# Patient Record
Sex: Male | Born: 1935 | Race: White | Hispanic: No | Marital: Married | Smoking: Former smoker
Health system: Southern US, Community
[De-identification: ages and names within clinical notes are randomized; demographics above are authoritative.]

## PROBLEM LIST (undated history)

## (undated) DIAGNOSIS — F419 Anxiety disorder, unspecified: Secondary | ICD-10-CM

## (undated) DIAGNOSIS — R269 Unspecified abnormalities of gait and mobility: Secondary | ICD-10-CM

## (undated) DIAGNOSIS — K219 Gastro-esophageal reflux disease without esophagitis: Secondary | ICD-10-CM

## (undated) DIAGNOSIS — F329 Major depressive disorder, single episode, unspecified: Secondary | ICD-10-CM

## (undated) DIAGNOSIS — F32A Depression, unspecified: Secondary | ICD-10-CM

## (undated) DIAGNOSIS — N200 Calculus of kidney: Secondary | ICD-10-CM

## (undated) DIAGNOSIS — I10 Essential (primary) hypertension: Secondary | ICD-10-CM

## (undated) DIAGNOSIS — R011 Cardiac murmur, unspecified: Secondary | ICD-10-CM

## (undated) DIAGNOSIS — C801 Malignant (primary) neoplasm, unspecified: Secondary | ICD-10-CM

## (undated) DIAGNOSIS — K8020 Calculus of gallbladder without cholecystitis without obstruction: Secondary | ICD-10-CM

## (undated) HISTORY — DX: Depression, unspecified: F32.A

## (undated) HISTORY — DX: Anxiety disorder, unspecified: F41.9

## (undated) HISTORY — DX: Unspecified abnormalities of gait and mobility: R26.9

## (undated) HISTORY — PX: SKIN GRAFT: SHX250

## (undated) HISTORY — DX: Calculus of kidney: N20.0

## (undated) HISTORY — DX: Major depressive disorder, single episode, unspecified: F32.9

## (undated) HISTORY — DX: Calculus of gallbladder without cholecystitis without obstruction: K80.20

---

## 1989-07-02 HISTORY — PX: KIDNEY STONE SURGERY: SHX686

## 2002-11-01 HISTORY — PX: COLONOSCOPY: SHX174

## 2002-11-01 HISTORY — PX: ESOPHAGOGASTRODUODENOSCOPY: SHX1529

## 2003-03-30 ENCOUNTER — Emergency Department (HOSPITAL_COMMUNITY): Admission: EM | Admit: 2003-03-30 | Discharge: 2003-03-30 | Payer: Self-pay | Admitting: Emergency Medicine

## 2003-07-03 ENCOUNTER — Ambulatory Visit (HOSPITAL_COMMUNITY): Admission: RE | Admit: 2003-07-03 | Discharge: 2003-07-03 | Payer: Self-pay | Admitting: Internal Medicine

## 2003-11-11 ENCOUNTER — Ambulatory Visit (HOSPITAL_COMMUNITY): Admission: RE | Admit: 2003-11-11 | Discharge: 2003-11-11 | Payer: Self-pay | Admitting: Internal Medicine

## 2003-11-19 ENCOUNTER — Ambulatory Visit (HOSPITAL_COMMUNITY): Admission: RE | Admit: 2003-11-19 | Discharge: 2003-11-19 | Payer: Self-pay | Admitting: Internal Medicine

## 2003-11-25 ENCOUNTER — Ambulatory Visit (HOSPITAL_COMMUNITY): Admission: RE | Admit: 2003-11-25 | Discharge: 2003-11-25 | Payer: Self-pay | Admitting: Internal Medicine

## 2004-09-04 ENCOUNTER — Encounter: Admission: RE | Admit: 2004-09-04 | Discharge: 2004-09-04 | Payer: Self-pay | Admitting: Neurosurgery

## 2006-11-01 HISTORY — PX: COLONOSCOPY: SHX174

## 2008-09-12 ENCOUNTER — Ambulatory Visit (HOSPITAL_COMMUNITY): Admission: RE | Admit: 2008-09-12 | Discharge: 2008-09-12 | Payer: Self-pay | Admitting: Internal Medicine

## 2008-09-18 ENCOUNTER — Encounter: Admission: RE | Admit: 2008-09-18 | Discharge: 2008-09-18 | Payer: Self-pay | Admitting: Internal Medicine

## 2008-09-18 ENCOUNTER — Other Ambulatory Visit: Admission: RE | Admit: 2008-09-18 | Discharge: 2008-09-18 | Payer: Self-pay | Admitting: Interventional Radiology

## 2008-09-18 ENCOUNTER — Encounter (INDEPENDENT_AMBULATORY_CARE_PROVIDER_SITE_OTHER): Payer: Self-pay | Admitting: Interventional Radiology

## 2008-09-24 ENCOUNTER — Ambulatory Visit (HOSPITAL_COMMUNITY): Admission: RE | Admit: 2008-09-24 | Discharge: 2008-09-24 | Payer: Self-pay | Admitting: General Surgery

## 2008-10-01 HISTORY — PX: THYROIDECTOMY: SHX17

## 2008-10-16 ENCOUNTER — Inpatient Hospital Stay (HOSPITAL_COMMUNITY): Admission: AD | Admit: 2008-10-16 | Discharge: 2008-10-18 | Payer: Self-pay | Admitting: Otolaryngology

## 2008-10-19 ENCOUNTER — Inpatient Hospital Stay (HOSPITAL_COMMUNITY): Admission: EM | Admit: 2008-10-19 | Discharge: 2008-10-22 | Payer: Self-pay | Admitting: Emergency Medicine

## 2008-12-02 ENCOUNTER — Encounter (HOSPITAL_COMMUNITY): Admission: RE | Admit: 2008-12-02 | Discharge: 2009-03-02 | Payer: Self-pay | Admitting: Otolaryngology

## 2008-12-05 ENCOUNTER — Ambulatory Visit (HOSPITAL_COMMUNITY): Admission: RE | Admit: 2008-12-05 | Discharge: 2008-12-16 | Payer: Self-pay | Admitting: Otolaryngology

## 2010-04-16 ENCOUNTER — Ambulatory Visit (HOSPITAL_COMMUNITY): Admission: RE | Admit: 2010-04-16 | Discharge: 2010-04-16 | Payer: Self-pay | Admitting: Internal Medicine

## 2010-11-22 ENCOUNTER — Encounter: Payer: Self-pay | Admitting: Otolaryngology

## 2011-03-16 NOTE — Discharge Summary (Signed)
Preston Martinez, Preston Martinez                ACCOUNT NO.:  [id number]   MEDICAL RECORD NO.:  [id number]          PATIENT TYPE:  INP   LOCATION:  6522                         FACILITY:  [facility]   PHYSICIAN:  Kinnie Scales. Annalee Genta, M.D.DATE OF BIRTH:  03-03-1925   DATE OF ADMISSION:  10/19/2008  DATE OF DISCHARGE:  10/22/2008                               DISCHARGE SUMMARY   DISCHARGE DIAGNOSES:  1. Status post total thyroidectomy for thyroid carcinoma.  (Dr. Pollyann Kennedy      on October 16, 2008).  2. Postoperative hypocalcemia.  3. Shortness of breath.  4. Dysphagia.   The patient is discharged to home in stable condition with company of  his family.   DISCHARGE MEDICATIONS:  1. Prinivil 20 mg p.o. daily.  2. Synthroid 100 mcg p.o. daily.  3. Vicodin 5/500 one to two p.o. q.4-6 h. p.r.n. pain.   ADDITIONAL DISCHARGE MEDICATIONS:  1. Vitamin D2 50,000 units p.o. daily.  2. Calcium carbonate (Tums) 3 tablets p.o. t.i.d.  3. Ventolin metered-dose inhaler 2 puffs p.o. b.i.d.   ADDITIONAL DISCHARGE INSTRUCTIONS AND WOUND CARE PRECAUTIONS:  Half-  strength hydrogen peroxide and bacitracin ointment applied to the  patient's anterior neck incision on a b.i.d. basis.  Limited physical  activity.  No lifting or straining, elevate head of bed, and use  incentive spirometer on an hourly basis while awake.  Diet is soft as  tolerated   BRIEF HISTORY:  Preston Martinez is an 77 year old white male who underwent  total thyroidectomy for thyroid carcinoma, surgery performed on October 16, 2008.  The patient had extensive lesion, which involved the  parapharyngeal musculature and left recurrent laryngeal nerve requiring  sacrifice of the nerve at the time of procedure.  He underwent  augmentation of the left vocal cord at the time of his surgical  procedure to improve voicing and swallow and help reduce the risk of  aspiration.  The patient had postoperative hypocalcemia and was sent  home with supplementation.   He initially discharged from the hospital on  October 18, 2008, in stable condition.  The following morning, the  patient had an episode of shortness of breath, throat tightness, and  difficulty clearing secretions.  He was brought to the [facility] Emergency Department for evaluation and possible admission.   HOSPITAL COURSE:  The patient was evaluated in the [facility]  Emergency Department on October 19, 2008.  The patient's calcium was  stable at 7.9.  He did not appear to have any neck infection, swelling,  or erythema and there was no stridor.  Oxygen saturation was stable.  Flexible nasal laryngoscopy was performed.  The patient had no evidence  of airway compromise or swelling.  Given the patient's history and  recent surgical procedure as well as family concerns, he was admitted to  the hospital for observation, admitted to unit 6500 for telemetry and  pulse oximetry.  The patient was monitored closely.  On the first  hospital day, he had another episode of perceived shortness of breath  exacerbated by anxiety, but oxygen level was stable throughout  with room  air saturations at 95% or better.  There was no significant stridor and  after coughing various secretions, he improved.  He continued to have  some dysphagia for soft diet.  The calcium levels were checked and  supplemented with calcium carbonate and vitamin D2.  Serum calcium level  had risen to 8.4 within normal limits.  The patient was started on  incentive spirometry and albuterol.  Chest x-ray was negative.  He had a  gradual improvement in symptoms and on the morning of October 22, 2008,  was stable, tolerating soft oral diet without difficulty.  No further  airway issues, and the patient's family felt it is safe for him to be  discharged to home.  The above discharge instructions were discussed in  detail with the patient and his family and he will follow up as an  outpatient to our office for  suture removal and further care.            ______________________________  Kinnie Scales. Annalee Genta, M.D.     DLS/MEDQ  D:  65/78/4696  T:  10/22/2008  Job:  [id number]

## 2011-03-16 NOTE — Op Note (Signed)
NAME:  Preston Martinez, Preston Martinez                ACCOUNT NO.:  [id number]   MEDICAL RECORD NO.:  [id number]          PATIENT TYPE:  OIB   LOCATION:  5123                         FACILITY:  [facility]   PHYSICIAN:  Jefry H. Pollyann Kennedy, MD     DATE OF BIRTH:  05-15-1925   DATE OF PROCEDURE:  DATE OF DISCHARGE:                               OPERATIVE REPORT   PREOPERATIVE DIAGNOSIS:  Thyroid carcinoma.   POSTOPERATIVE DIAGNOSIS:  Thyroid carcinoma.   PROCEDURE:  Total thyroidectomy and hydroxyapatite injection of the left  vocal cord for medialization.   SURGEON:  Jefry H. Pollyann Kennedy, MD.   SECONDARY POSTOPERATIVE DIAGNOSIS:  Left vocal cord paralysis.   COMPLICATIONS:  No complications.   FINDINGS:  Very large rock hard mass involving the majority of the left  lobe of the thyroid with some erosion into the left thyroid cartilage  and into the internal jugular vein.  The carotid artery was clear.  The  esophagus was clear.  There was no obvious adenopathy.  A right inferior  parathyroid was preserved with his blood supply.  The right recurrent  nerve was preserved as well.  The left recurrent nerve was sacrificed  because of tumor involvement.   REFERRING PHYSICIAN:  Angelia Mould. Derrell Lolling, MD  Kingsley Callander Ouida Sills, MD   HISTORY:  An 77 year old with a biopsy-proven papillary carcinoma of the  left thyroid with radiographic evidence of invasion into the larynx,  esophagus, and the great vessels.  Risks, benefits, alternatives, and  complications to the procedure were explained to the patient and his  family.  He seemed to understand and agreed to surgery.   PROCEDURE:  The patient was taken to the operating room and placed in  the operating room table in supine position.  Following induction of  general endotracheal anesthesia, the neck was prepped and draped in  standard fashion.  Transverse incision was outlined overlying the  clavicle and electrocautery was used to incise through skin and  subcutaneous tissue and  the platysma layer.  Subplatysmal flaps were  developed superiorly to the thyroid notch and inferiorly to the  clavicle.  Thyroid retractor was used throughout the case.  The midline  fascia was divided and the right side strap muscles were reflected  laterally exposing the right lobe of the gland.  There were some  nodularity, but no obvious tumor present.  The superior vasculature were  separately identified ligated between clamps and divided.  The middle  thyroid vein was treated similarly.  The inferior vasculature was also  individually ligated between clamps and divided.   Parathyroid was identified and was preserved with his blood supply as  well.  The ligament of  Allyson Sabal was divided using electrocautery and the  thyroid lobe was brought off the face of the trachea.  The recurrent  nerve was left in place and intact.  The left side dissection was then  accomplished.  The strap muscles were sacrificed surrounding the tumor  because of adherence to the tumor.  The tumor was also infiltrating into  the internal jugular vein and this was also sacrificed  by ligating  between clamps and dividing using 2-0 silk free tie and 3-0 silk stick  tie on the stumps upper and lower.  The common carotid artery was  identified and I was able to obtain a clean dissection plane between the  tumor and the vessel.  The biggest nerve I believe was also kept intact  posterior to the carotid.  Dissection continued towards the medial  direction and the esophagus was dissected free at the tumor.  The tumor  was infiltrating and adhesed to the lower part of the thyroid cartilage  where there was a scalped area about 1.5 cm at the lower aspect of the  thyroid cartilage that appeared to have been everted.  There was no  obvious tumor growth into the larynx, however.  The cricothyroid  membrane was intact.  The recurrent nerve entry was not identified  during this part of the dissection and it was presumed to be  incase by  tumor and was less sacrificed.  The pretracheal tissue was dissected as  well as the thyroid was riding fairly low in the neck and there was  minimal specimen, but there were some fiber fatty tissue and couple of  very small benign-appearing lymph nodes.   The inferior thyroid vasculature on the right was preserved along with  the parathyroid gland.  The wound was irrigated with saline.  Hemostasis  was completed using cautery and ties as needed.  The wound was closed in  layers using 3-0 chromic and the midline fascia and the platysmal layer.  The 5-0 nylon was used on the skin.  A 7-French round JP drain was left  in the wound actually through the right side.  It was secured and placed  with nylon suture.   Direct laryngoscopy with hydroxyapatite injection of left vocal cord.  The patient was allowed to start breathing spontaneously.  The maxillary  tooth protector was used.  The Jako laryngoscope was used to view the  larynx and with stimulation by the endotracheal tube.  The right cord  removed.  The left one did not remove at all.  Hydroxyapatite injection  was then performed in left vocal cord concentrating posteriorly,  injecting about 0.6 mL of the hydroxyapatite.  There was nice fullness  that resulted from the injection of the posterior cord on the left.  The  scope was then removed.  The patient was awakened, extubated,  transferred to recovery in stable condition.      Jefry H. Pollyann Kennedy, MD  Electronically Signed     JHR/MEDQ  D:  10/16/2008  T:  10/17/2008  Job:  [id number]   cc:   Angelia Mould. Derrell Lolling, M.D.  Kingsley Callander. Ouida Sills, MD

## 2011-03-19 NOTE — Op Note (Signed)
NAME:  Preston Martinez, Preston Martinez                          ACCOUNT NO.:  [id number]   MEDICAL RECORD NO.:  [id number]                   PATIENT TYPE:  AMB   LOCATION:  DAY                                  FACILITY:  [facility]   PHYSICIAN:  Lionel December, M.D.                 DATE OF BIRTH:  [birth year]   DATE OF PROCEDURE:  07/03/2003  DATE OF DISCHARGE:                                 OPERATIVE REPORT   PROCEDURE:  Esophagogastroduodenoscopy with esophageal dilatation followed  by total colonoscopy with polypectomy.   INDICATION:  Preston Martinez is a 77 year old, Caucasian male, who is noted to have  microcytic anemia by Preston Martinez. Preston Martinez, M.D.  His stool is guaiac negative.  He  does give ______ history of dysphagia and intermittent heartburn.  It is  possible that his microcytic anemia is due to repeated blood donation.  He  is undergoing diagnostic evaluation.  Procedure risks were reviewed with the  patient.  Informed consent was obtained.   PREOPERATIVE MEDICATIONS:  1. Cetacaine spray for pharyngeal topical anesthesia.  2. Demerol 25 mg IV.  3. Versed 4 mg IV.   FINDINGS:  Procedure is performed in endoscopy suite.  The patient's vital  signs and O2 saturations were monitored during the procedure, remained  stable.   PROCEDURE #1:  Esophagogastroduodenoscopy.  The patient was placed in the  left lateral decubitus position.  Endoscope was passed via oropharynx  without any difficulty into esophagus.   ESOPHAGUS:  Mucosa of the esophagus normal.  He had soft stricture at GE  junction with mucosal erythema and edema and a small sliding hiatal hernia  no more than 3 cm in length.   STOMACH:  It was empty and distended very well with insufflation.  Folds of  the proximal stomach were normal.  Examination of mucosa at body, antrum,  pyloric channel, as well as angularis, fundus, and cardia was normal.   DUODENUM:  Examination of the bulb, second part of the duodenum was also  normal.  Endoscope was  withdrawn.   Esophagus was dilated by passing 54 [place] Maloney dilator to full  insertion.  Esophagus was reexamined postdilatation, and the stricture was  noted to have been effectively disrupted.  Picture taken for the record.  Endoscope was withdrawn and patient prepared for procedure #2.   TOTAL COLONOSCOPY:  A rectal examination was performed.  No abnormality  noted on external or digital exam.  Olympus videoscope was placed in the  rectum and advanced under direct vision to sigmoid colon and beyond.  Preparation was satisfactory.  Multiple diverticula were noted at the  sigmoid and descending colon with a few more in the proximal colon.  The  scope was passed cecum which was identified by ileocecal valve.  Blunt end  of cecum was also well seen and was normal.  There was a small polyp to the  left of appendiceal orifice  which was ablated via cold biopsy, and there was  one above the ileocecal valve.  This was also ablated via cold biopsy.  There was at least a 15 mm flat polyp at hepatic flexure which was snared  piecemeal.  All three pieces were retrieved for histologic examination.  Mucosa of rest of the colon was normal.  In the rectum, there were two small  polyps that were ablated by coagulation.  The scope was retroflexed to  examine anorectal junction which was unremarkable.  Endoscope was  straightened and withdrawn.  The patient tolerated the procedure well.   FINAL DIAGNOSES:  1. A soft stricture at gastroesophageal junction with changes of reflux     esophagitis limited to gastroesophageal junction.  2. Small sliding hiatal hernia.  Normal examination of stomach plus second     part of the duodenum.  3. Esophagus dilated by passing 54 [place] Maloney dilator.  4. Pancolonic diverticulosis.  5. Large 15 mm flat polyp snared from hepatic flexure in piecemeal fashion.  6. Two small polyps from the ascending colon/cecum were ablated via cold     biopsy.  Two small polyps  of rectum were coagulated using snares.   RECOMMENDATIONS:  1. Standard instructions given.  2. Antireflux measures.  3. Prilosec 20 mg before breakfast daily.  4. Citrucel 1 tablespoonful daily along with high fiber diet.  5. I will be contacting patient with biopsy results and further     recommendations.  6. He will also resume his iron.  I would like for him to wait at least 3-4     months before he goes for another blood donation.  If he is going to do     this on regular basis, he needs to be on iron therapy chronically.                                               Lionel December, M.D.    NR/MEDQ  D:  07/03/2003  T:  07/03/2003  Job:  [id number]   cc:   Preston Martinez. Preston Martinez, M.D.  [address]  [place]  [place] [address]  Fax: [phone]

## 2011-05-12 ENCOUNTER — Emergency Department (HOSPITAL_COMMUNITY): Payer: Medicare Other

## 2011-05-12 ENCOUNTER — Inpatient Hospital Stay (HOSPITAL_COMMUNITY)
Admission: EM | Admit: 2011-05-12 | Discharge: 2011-05-14 | DRG: 392 | Disposition: A | Discharge status: Home / Self Care | Payer: Medicare Other | Attending: Internal Medicine | Admitting: Internal Medicine

## 2011-05-12 DIAGNOSIS — Z860100 Personal history of colon polyps, unspecified: Secondary | ICD-10-CM

## 2011-05-12 DIAGNOSIS — K8020 Calculus of gallbladder without cholecystitis without obstruction: Secondary | ICD-10-CM | POA: Diagnosis present

## 2011-05-12 DIAGNOSIS — R1084 Generalized abdominal pain: Secondary | ICD-10-CM

## 2011-05-12 DIAGNOSIS — K219 Gastro-esophageal reflux disease without esophagitis: Secondary | ICD-10-CM

## 2011-05-12 DIAGNOSIS — K6389 Other specified diseases of intestine: Secondary | ICD-10-CM | POA: Diagnosis present

## 2011-05-12 DIAGNOSIS — D72829 Elevated white blood cell count, unspecified: Secondary | ICD-10-CM | POA: Diagnosis present

## 2011-05-12 DIAGNOSIS — R1011 Right upper quadrant pain: Principal | ICD-10-CM | POA: Diagnosis present

## 2011-05-12 DIAGNOSIS — R011 Cardiac murmur, unspecified: Secondary | ICD-10-CM | POA: Insufficient documentation

## 2011-05-12 DIAGNOSIS — R1012 Left upper quadrant pain: Secondary | ICD-10-CM | POA: Diagnosis present

## 2011-05-12 DIAGNOSIS — Z85850 Personal history of malignant neoplasm of thyroid: Secondary | ICD-10-CM

## 2011-05-12 HISTORY — DX: Malignant (primary) neoplasm, unspecified: C80.1

## 2011-05-12 HISTORY — DX: Essential (primary) hypertension: I10

## 2011-05-12 HISTORY — DX: Cardiac murmur, unspecified: R01.1

## 2011-05-12 HISTORY — DX: Gastro-esophageal reflux disease without esophagitis: K21.9

## 2011-05-12 LAB — URINALYSIS, ROUTINE W REFLEX MICROSCOPIC
Bilirubin Urine: NEGATIVE
Glucose, UA: NEGATIVE mg/dL
Hgb urine dipstick: NEGATIVE
Leukocytes, UA: NEGATIVE
Nitrite: NEGATIVE
Protein, ur: NEGATIVE mg/dL
Specific Gravity, Urine: 1.015 (ref 1.005–1.030)
Urobilinogen, UA: 0.2 mg/dL (ref 0.0–1.0)
pH: 7 (ref 5.0–8.0)

## 2011-05-12 LAB — DIFFERENTIAL
Basophils Absolute: 0.1 10*3/uL (ref 0.0–0.1)
Basophils Relative: 0 % (ref 0–1)
Eosinophils Absolute: 0.1 10*3/uL (ref 0.0–0.7)
Eosinophils Relative: 1 % (ref 0–5)
Lymphocytes Relative: 13 % (ref 12–46)
Lymphs Abs: 2.2 10*3/uL (ref 0.7–4.0)
Monocytes Absolute: 1.5 10*3/uL — ABNORMAL HIGH (ref 0.1–1.0)
Monocytes Relative: 9 % (ref 3–12)
Neutro Abs: 12.6 10*3/uL — ABNORMAL HIGH (ref 1.7–7.7)

## 2011-05-12 LAB — CBC
HCT: 43.9 % (ref 39.0–52.0)
Hemoglobin: 15.1 g/dL (ref 13.0–17.0)
MCH: 30.8 pg (ref 26.0–34.0)
MCHC: 34.4 g/dL (ref 30.0–36.0)
MCHC: 34.8 g/dL (ref 30.0–36.0)
MCV: 89 fL (ref 78.0–100.0)
Platelets: 229 10*3/uL (ref 150–400)
Platelets: 215 10*3/uL (ref 150–400)
RBC: 4.93 MIL/uL (ref 4.22–5.81)
WBC: 16.4 10*3/uL — ABNORMAL HIGH (ref 4.0–10.5)

## 2011-05-12 LAB — BASIC METABOLIC PANEL
BUN: 25 mg/dL — ABNORMAL HIGH (ref 6–23)
CO2: 31 mEq/L (ref 19–32)
Calcium: 9.5 mg/dL (ref 8.4–10.5)
Chloride: 95 mEq/L — ABNORMAL LOW (ref 96–112)
Creatinine, Ser: 1.2 mg/dL (ref 0.50–1.35)
GFR calc Af Amer: >60 mL/min (ref >60)
Glucose, Bld: 133 mg/dL — ABNORMAL HIGH (ref 70–99)
Sodium: 136 mEq/L (ref 135–145)

## 2011-05-12 LAB — HEPATIC FUNCTION PANEL
ALT: 14 U/L (ref 0–53)
AST: 21 U/L (ref 0–37)
Bilirubin, Direct: 0.1 mg/dL (ref 0.0–0.3)
Indirect Bilirubin: 0.6 mg/dL (ref 0.3–0.9)
Total Bilirubin: 0.7 mg/dL (ref 0.3–1.2)

## 2011-05-12 LAB — LIPASE, BLOOD: Lipase: 66 U/L — ABNORMAL HIGH (ref 11–59)

## 2011-05-12 LAB — CREATININE, SERUM
Creatinine, Ser: 1.16 mg/dL (ref 0.50–1.35)
GFR calc non Af Amer: 60 mL/min — ABNORMAL LOW (ref >60)

## 2011-05-12 MED ORDER — ENOXAPARIN SODIUM 80 MG/0.8ML ~~LOC~~ SOLN: 40.0000 mg | SUBCUTANEOUS | Status: DC

## 2011-05-12 MED ORDER — POLYETHYLENE GLYCOL 3350 17 G PO PACK
17.0000 g | PACK | Freq: Every day | ORAL | Status: DC
  Administered 2011-05-13: 17 via ORAL
  Filled 2011-05-12: qty 1

## 2011-05-12 MED ORDER — ONDANSETRON HCL 4 MG/2ML IJ SOLN
4.0000 mg | Freq: Once | INTRAMUSCULAR | Status: DC
  Filled 2011-05-12: qty 2

## 2011-05-12 MED ORDER — CIPROFLOXACIN IN D5W 400 MG/200ML IV SOLN
INTRAVENOUS | Status: AC
  Filled 2011-05-12: qty 200

## 2011-05-12 MED ORDER — ACETAMINOPHEN 325 MG PO TABS: 650.0000 mg | ORAL_TABLET | Freq: Four times a day (QID) | ORAL | Status: DC | PRN

## 2011-05-12 MED ORDER — CIPROFLOXACIN IN D5W 400 MG/200ML IV SOLN
400.0000 mg | Freq: Two times a day (BID) | INTRAVENOUS | Status: DC
  Administered 2011-05-13 – 2011-05-14 (×4): 400 mg via INTRAVENOUS
  Filled 2011-05-12 (×10): qty 200

## 2011-05-12 MED ORDER — DEXTROSE 5 % IV SOLN
1.0000 g | INTRAVENOUS | Status: DC
  Administered 2011-05-13 (×2): 1 g via INTRAVENOUS
  Filled 2011-05-12 (×5): qty 1

## 2011-05-12 MED ORDER — ONDANSETRON HCL 4 MG PO TABS: 4.0000 mg | ORAL_TABLET | Freq: Four times a day (QID) | ORAL | Status: DC | PRN

## 2011-05-12 MED ORDER — ZOLPIDEM TARTRATE 5 MG PO TABS
10.0000 mg | ORAL_TABLET | Freq: Every evening | ORAL | Status: DC | PRN
  Administered 2011-05-12 – 2011-05-13 (×2): 10 mg via ORAL
  Filled 2011-05-12 (×2): qty 2

## 2011-05-12 MED ORDER — SODIUM CHLORIDE 0.9 % IV SOLN
20.0000 mL | INTRAVENOUS | Status: DC
  Administered 2011-05-12: 20 mL via INTRAVENOUS
  Administered 2011-05-14: 1000 mL via INTRAVENOUS

## 2011-05-12 MED ORDER — ONDANSETRON HCL 4 MG/2ML IJ SOLN: 4.0000 mg | Freq: Four times a day (QID) | INTRAMUSCULAR | Status: DC | PRN

## 2011-05-12 MED ORDER — DEXTROSE 5 % IV SOLN
INTRAVENOUS | Status: AC
  Filled 2011-05-12: qty 1

## 2011-05-12 MED ORDER — ENOXAPARIN SODIUM 40 MG/0.4ML ~~LOC~~ SOLN
SUBCUTANEOUS | Status: AC
  Filled 2011-05-12: qty 0.4

## 2011-05-12 MED ORDER — ACETAMINOPHEN 650 MG RE SUPP: 650.0000 mg | Freq: Four times a day (QID) | RECTAL | Status: DC | PRN

## 2011-05-12 MED ORDER — MORPHINE SULFATE 2 MG/ML IJ SOLN: 2.0000 mg | INTRAMUSCULAR | Status: DC | PRN

## 2011-05-12 MED ORDER — IOHEXOL 300 MG/ML  SOLN
100.0000 mL | Freq: Once | INTRAMUSCULAR | Status: AC | PRN
  Administered 2011-05-12: 100 mL via INTRAVENOUS

## 2011-05-12 NOTE — ED Notes (Signed)
Dr. Juanetta Gosling here to evaluate pt for admission

## 2011-05-12 NOTE — ED Provider Notes (Signed)
History     Chief Complaint  Patient presents with  . Abdominal Pain   HPI Comments: Pt c/o constant left flank pain, described as burning, radiating to his lower abd x 2-3 days. +decreased appetite. No BM in 3-4 days which is unusual for him. No h/o abd surgeries or SBO. No n/v/d or f/c. No difficulty urinating. Pt has tried milk of magnesia x 2 as well as a suppository with no relief.  Patient is a 77 y.o. male presenting with abdominal pain. The history is provided by the patient, the spouse and a relative.  Abdominal Pain The primary symptoms of the illness include abdominal pain. The primary symptoms of the illness do not include fever, fatigue, shortness of breath, nausea, vomiting, diarrhea, hematemesis, hematochezia or dysuria. The current episode started more than 2 days ago. The problem has not changed since onset. The abdominal pain began 2 days ago. The pain came on gradually. The abdominal pain has been gradually worsening since its onset. The abdominal pain is located in the left flank. The abdominal pain radiates to the RLQ and LLQ. The abdominal pain is relieved by nothing.  Additional symptoms associated with the illness include constipation. Symptoms associated with the illness do not include chills, anorexia, diaphoresis, heartburn, urgency, hematuria, frequency or back pain.    Past Medical History  Diagnosis Date  . Heart murmur   . Cancer     thyroid cancer    Past Surgical History  Procedure Date  . Thyroidectomy   . Skin graft     Family History  Problem Relation Age of Onset  . Cancer Brother     History  Substance Use Topics  . Smoking status: Never Smoker   . Smokeless tobacco: Not on file  . Alcohol Use: No      Review of Systems  Constitutional: Negative for fever, chills, diaphoresis and fatigue.  HENT: Negative for congestion and rhinorrhea.   Eyes: Negative for redness.  Respiratory: Negative for cough and shortness of breath.     Cardiovascular: Negative for chest pain and leg swelling.  Gastrointestinal: Positive for abdominal pain and constipation. Negative for heartburn, nausea, vomiting, diarrhea, hematochezia, anorexia and hematemesis.  Genitourinary: Negative for dysuria, urgency, frequency, hematuria and flank pain.  Musculoskeletal: Negative for back pain.  Skin: Negative for rash.  Neurological: Negative for headaches.    Physical Exam  BP 151/64  Pulse 78  Temp(Src) 98.1 F (36.7 C) (Oral)  Resp 20  Ht 5\' 8"  (1.727 m)  Wt 155 lb (70.308 kg)  BMI 23.57 kg/m2  SpO2 95%  Physical Exam  Nursing note and vitals reviewed. Constitutional: He is oriented to person, place, and time. He appears well-developed and well-nourished. No distress.  HENT:  Head: Normocephalic and atraumatic.  Mouth/Throat: Mucous membranes are dry.  Eyes: Conjunctivae and EOM are normal. Pupils are equal, round, and reactive to light.  Neck: Normal range of motion. Neck supple.  Cardiovascular: Normal rate and regular rhythm.   Murmur heard. Pulmonary/Chest: Effort normal and breath sounds normal. He has no wheezes. He exhibits no tenderness.  Abdominal: Soft. Bowel sounds are normal. He exhibits no mass. There is tenderness. There is no rebound and no guarding.       Tenderness RLQ and LLQ  Musculoskeletal: Normal range of motion. He exhibits no edema and no tenderness.  Lymphadenopathy:    He has no cervical adenopathy.  Neurological: He is alert and oriented to person, place, and time. He has normal strength.  No cranial nerve deficit or sensory deficit.  Skin: Skin is warm and dry. No rash noted.  Psychiatric: He has a normal mood and affect.    ED Course  I personally performed the services described in this documentation, which was scribed in my presence. The recorded information has been reviewed and considered.   Procedures Written by Enos Fling acting as scribe for Dr. Deretha Emory.  MDM DW DR Juanetta Gosling  ADMITTING FOR DR Ouida Sills. CONCERNED ABOUT ELEVATED WBC, LIPASE, AND CT ABNORMALITIES ALTHOUGH NONE SPECIFIC. RECOMMEND ADMISSION NPO REPEAT LABS AND PERHAPS COLONOSCOPY.       Shelda Jakes, MD 05/12/11 2117

## 2011-05-12 NOTE — ED Notes (Signed)
EDP in prior to RN, pt sitting on side of bed, denies any nausea at present, refusing zofran, pt drinking water for ct scan, family at bedside

## 2011-05-12 NOTE — ED Notes (Signed)
  Pt transported to ct 

## 2011-05-12 NOTE — ED Notes (Signed)
Pt ambulatory to restroom, states that he feels better, family at bedside, pt and family updated on plan of care

## 2011-05-12 NOTE — ED Notes (Signed)
Pt reports stomach feels like is burning.  Says started Saturday.  Pt says is having pain in left flank around to abd.  Denies difficulty urinating.  Says voids a little bit at a time but says is not unusual.  Denies n/v.  LBM was 3 -4 days ago.  Says usually has BM on a daily basis.

## 2011-05-12 NOTE — ED Notes (Signed)
Pt chart pulled to floor by 300 staff prior to pt being discharged from er, report called to nadine, Rn

## 2011-05-12 NOTE — H&P (Signed)
NAMERAJ, LANDRESS NO.:  [id number]  MEDICAL RECORD NO.:  [id number]  LOCATION:  A306                          FACILITY:  [facility]  PHYSICIAN:  Abbygale Lapid L. Juanetta Gosling, M.D.DATE OF BIRTH:  [birth year]  DATE OF ADMISSION:  05/12/2011 DATE OF DISCHARGE:  LH                             HISTORY & PHYSICAL   REASON FOR ADMISSION:  Abdominal pain.  HISTORY:  Mr. Hagemann is an 77 year old who said that he had been having abdominal pain for something around 4-5 days.  It started in his flank on the left.  It has moved into his lower abdomen.  He has some in the right upper quadrant and some in the left upper quadrant.  The pain has kept him awake.  He finally ended up having to go to bed.  He has tried suppositories, he has tried milk of magnesia, none of that has seemed to make any difference.  He has not ever had anything similar to this.  He has not had any fever but did notice that he had a fever blister.  He has not had any nausea, vomiting, or diarrhea but has been constipated. It is not exactly a cramping pain.  He has not noticed anything that relieves it.  He says that it is not got into his chest.  He has a history of GERD, but he has not had any GERD associated with this.  PAST MEDICAL HISTORY:  Positive for a heart murmur, I not sure what that was.  History of thyroid cancer, he has had a thyroidectomy.  FAMILY HISTORY:  Positive for cancer in a brother.  SOCIAL HISTORY:  He is married, lives at home with his wife.  He does not use tobacco and does not use any alcohol.  REVIEW OF SYSTEMS:  Except as mentioned is negative.  PHYSICAL EXAMINATION:  VITAL SIGNS:  Blood pressure 151/64, pulse 78, he is afebrile, respirations 20, his height is 5 feet 8 inches, and weight 155 pounds, BMI is 23.57, and O2 saturation 95% on room air. HEENT:  His pupils are reactive to light and accommodation.  Mucous membranes are slightly dry. NECK:  Supple without masses. CARDIAC:   He does have a murmur, but his heart is regular. EXTREMITIES:  No edema. ABDOMEN:  He is mildly diffusely tender.  Bowel sounds present and active.  He does not have any rebound.  He does not have any masses. CENTRAL NERVOUS SYSTEM:  Grossly intact.  The rest of his exam is essentially unremarkable.  LABORATORY WORK:  BUN is 25, creatinine 1.2, glucose of 133, lipase of 66 which is somewhat elevated, protein is 8.9 which is elevated.  White blood count 16,400 which is elevated, his hemoglobin is 15.1, and his platelets 229.  Urinalysis was normal.  CT abdomen and pelvis shows lungs show mild emphysematous change.  No consolidation.  He has distention of the cecum and ascending colon with no mass, moderate descending and sigmoid diverticulosis without diverticulitis, cystic lesions of the liver and kidneys which appear to be benign, coronary artery calcifications, cholelithiasis without cholecystitis.  My assessment is that he has abdominal discomfort.  It is not totally clear what  the cause of this is and I would plan to have him treated with antibiotics.  I am going to ask for GI consultation once Dr. Ouida Sills sees him and see how he does.     Shanelle Clontz L. Juanetta Gosling, M.D.     ELH/MEDQ  D:  05/12/2011  T:  05/12/2011  Job:  [id number]

## 2011-05-13 ENCOUNTER — Encounter (HOSPITAL_COMMUNITY): Payer: Self-pay | Admitting: Gastroenterology

## 2011-05-13 DIAGNOSIS — R109 Unspecified abdominal pain: Secondary | ICD-10-CM

## 2011-05-13 DIAGNOSIS — R933 Abnormal findings on diagnostic imaging of other parts of digestive tract: Secondary | ICD-10-CM

## 2011-05-13 LAB — DIFFERENTIAL
Basophils Absolute: 0 10*3/uL (ref 0.0–0.1)
Lymphocytes Relative: 11 % — ABNORMAL LOW (ref 12–46)
Lymphs Abs: 1.1 10*3/uL (ref 0.7–4.0)
Monocytes Absolute: 0.9 10*3/uL (ref 0.1–1.0)
Neutro Abs: 7.7 10*3/uL (ref 1.7–7.7)

## 2011-05-13 LAB — COMPREHENSIVE METABOLIC PANEL
Albumin: 3.6 g/dL (ref 3.5–5.2)
BUN: 24 mg/dL — ABNORMAL HIGH (ref 6–23)
Calcium: 8.5 mg/dL (ref 8.4–10.5)
Creatinine, Ser: 1.13 mg/dL (ref 0.50–1.35)
GFR calc Af Amer: >60 mL/min (ref >60)
Glucose, Bld: 119 mg/dL — ABNORMAL HIGH (ref 70–99)
Total Protein: 7.5 g/dL (ref 6.0–8.3)

## 2011-05-13 LAB — CBC
HCT: 38.9 % — ABNORMAL LOW (ref 39.0–52.0)
Hemoglobin: 13.1 g/dL (ref 13.0–17.0)
RBC: 4.37 MIL/uL (ref 4.22–5.81)
RDW: 13.1 % (ref 11.5–15.5)
WBC: 9.8 10*3/uL (ref 4.0–10.5)

## 2011-05-13 MED ORDER — ENOXAPARIN SODIUM 40 MG/0.4ML ~~LOC~~ SOLN
SUBCUTANEOUS | Status: AC
  Administered 2011-05-13: 40 mg via SUBCUTANEOUS
  Filled 2011-05-13: qty 0.4

## 2011-05-13 MED ORDER — SODIUM CHLORIDE 0.9 % IJ SOLN
INTRAMUSCULAR | Status: AC
  Administered 2011-05-13: 23:00:00
  Filled 2011-05-13: qty 6

## 2011-05-13 MED ORDER — LISINOPRIL 10 MG PO TABS
20.0000 mg | ORAL_TABLET | Freq: Every day | ORAL | Status: DC
  Administered 2011-05-13 – 2011-05-14 (×2): 20 mg via ORAL
  Filled 2011-05-13 (×2): qty 2

## 2011-05-13 MED ORDER — POLYETHYLENE GLYCOL 3350 17 G PO PACK
17.0000 g | PACK | Freq: Two times a day (BID) | ORAL | Status: DC
  Administered 2011-05-13: 17 g via ORAL
  Administered 2011-05-14: 09:00:00 via ORAL
  Filled 2011-05-13 (×2): qty 1

## 2011-05-13 MED ORDER — ENOXAPARIN SODIUM 40 MG/0.4ML ~~LOC~~ SOLN
40.0000 mg | Freq: Every day | SUBCUTANEOUS | Status: DC
  Administered 2011-05-13 (×2): 40 mg via SUBCUTANEOUS
  Filled 2011-05-13: qty 0.4

## 2011-05-13 MED ORDER — ENOXAPARIN SODIUM 40 MG/0.4ML ~~LOC~~ SOLN: 40.0000 mg | SUBCUTANEOUS | Status: DC

## 2011-05-13 MED ORDER — SODIUM CHLORIDE 0.9 % IJ SOLN
INTRAMUSCULAR | Status: AC
  Administered 2011-05-13: 3 mL
  Filled 2011-05-13: qty 3

## 2011-05-13 MED ORDER — SODIUM CHLORIDE 0.9 % IJ SOLN
INTRAMUSCULAR | Status: AC
  Administered 2011-05-13: 3 mL via INTRAVENOUS
  Filled 2011-05-13: qty 3

## 2011-05-13 MED ORDER — PANTOPRAZOLE SODIUM 40 MG PO TBEC
80.0000 mg | DELAYED_RELEASE_TABLET | Freq: Every day | ORAL | Status: DC
  Administered 2011-05-13: 80 mg via ORAL
  Filled 2011-05-13: qty 2

## 2011-05-13 MED ORDER — LEVOTHYROXINE SODIUM 75 MCG PO TABS
150.0000 ug | ORAL_TABLET | Freq: Every day | ORAL | Status: DC
  Administered 2011-05-13 – 2011-05-14 (×2): 150 ug via ORAL
  Filled 2011-05-13 (×2): qty 2

## 2011-05-13 MED ORDER — PANTOPRAZOLE SODIUM 40 MG PO TBEC
40.0000 mg | DELAYED_RELEASE_TABLET | Freq: Every day | ORAL | Status: DC
  Administered 2011-05-14: 40 mg via ORAL
  Filled 2011-05-13: qty 1

## 2011-05-13 NOTE — Progress Notes (Signed)
NAMEARION, SHANKLES NO.:  [id number]  MEDICAL RECORD NO.:  [id number]  LOCATION:  A306                          FACILITY:  [facility]  PHYSICIAN:  Kingsley Callander. Ouida Sills, MD       DATE OF BIRTH:  1924-12-22  DATE OF PROCEDURE: DATE OF DISCHARGE:                                PROGRESS NOTE   TIME:  8:05 a.m.  Mr. Carneiro was admitted last night with abdominal pain, which he had experienced for approximately 4 days.  It started in his left upper quadrant, but then had moved to involve his abdomen diffusely.  He denied any vomiting.  His last bowel movement had been 3 days prior to admission.  He had not had any bleeding.  He denied fever.  He had a white count yesterday of 14.6 and had a CT scan, which revealed moderate distention of the cecum in the ascending colon without discrete obstructing mass.  Cholelithiasis was present without evidence of cholecystitis.  He denies specific tenderness in his right upper quadrant.  His last meal had been a bowl of cereal yesterday for breakfast.  He feels better this morning.  He does not have any pain, but states he has a little sensitivity in his midabdomen.  VITAL SIGNS:  Temperature is 97.9, pulse 66, respirations 16, blood pressure 160/71. LUNGS:  Clear. HEART:  Regular with a grade 2 systolic murmur. ABDOMEN:  Nondistended.  Bowel sounds are present.  He really has no significant tenderness, and there is no palpable hepatosplenomegaly.  IMPRESSION/PLAN: 1. Abdominal pain.  His white count is dropped from 14.6-9.8.  He has     reportedly had a colonoscopy 4 years ago.  We will consult     Gastroenterology in regard to his bowel dilatation.  He does not     appear to be obstructed at this point.  He is on a clear liquid     diet.  His serum sodium is slightly low at 133 down from 136.  BUN     and creatinine are stable at 24 and 1.1. Bicarb and calcium are normal.  He was started empirically yesterday on ceftriaxone and  ciprofloxacin. 1. History of thyroid carcinoma.     Kingsley Callander. Ouida Sills, MD     ROF/MEDQ  D:  05/13/2011  T:  05/13/2011  Job:  [id number]

## 2011-05-13 NOTE — Consult Note (Signed)
Patient seen and examined. Please see our consultation note. I have reviewed the CAT scan films with Dr. Tyron Russell.  Cecum is markedly dilated without evidence of obstruction or obstipation. Clinically, patient reports constipation recently. Query early Ogilvie's syndrome. Bowel ischemia would remain in the differential at this time.  However, pt states he is feeling much better today after a good BM. He has only minimal abdominal tenderness to palpation on exam this afternoon.  Would suggest that we continue the recommended  bowel regimen and observe him over the next 24 hours.
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
## 2011-05-14 MED ORDER — SODIUM CHLORIDE 0.9 % IJ SOLN
INTRAMUSCULAR | Status: AC
  Administered 2011-05-14: 3 mL via INTRAVENOUS
  Filled 2011-05-14: qty 3

## 2011-05-14 NOTE — Progress Notes (Signed)
  Subjective: Preston Martinez is feeling much better today. He had two good BMs since admission. Denies abdominal pain. Ate 100% of breakfast. No n/v. No fever.   Objective: Vital signs in last 24 hours: Temp:  [97.6 F (36.4 C)-98.1 F (36.7 C)] 97.7 F (36.5 C) (07/13 0600) Pulse Rate:  [65-72] 72  (07/13 0600) Resp:  [20] 20  (07/13 0600) BP: (106-147)/(56-76) 126/76 mmHg (07/13 0600) SpO2:  [94 %-97 %] 97 % (07/13 0600) Last BM Date: 05/13/11 General:   Alert,  Well-developed, well-nourished, pleasant and cooperative in NAD Head:  Normocephalic and atraumatic. Neck:  Supple  Heart:  Regular rate and rhythm; no murmurs, clicks, rubs,  or gallops. Abdomen:  Soft, nontender and nondistended. No masses, hepatosplenomegaly or hernias noted. Normal bowel sounds, without guarding, and without rebound.   Msk:  Symmetrical without gross deformities. Normal posture. Extremities:  Without clubbing or edema. Neurologic:  Alert and  oriented x4;  grossly normal neurologically. Skin:  Intact without significant lesions or rashes. Cervical Nodes:  No significant cervical adenopathy. Psych:  Alert and cooperative. Normal mood and affect.  Intake/Output from previous day: 07/12 0701 - 07/13 0700 In: 1188.7 [P.O.:840; I.V.:98.7; IV Piggyback:250] Out: 600 [Urine:600] Intake/Output this shift: I/O this shift: In: 600 [P.O.:600] Out: -   Lab Results:  Basename 05/13/11 0503 05/12/11 2208 05/12/11 1751  WBC 9.8 14.6* 16.4*  HGB 13.1 14.5 15.1  HCT 38.9* 41.7 43.9  PLT 180 215 229   BMET  Basename 05/13/11 0503 05/12/11 2208 05/12/11 1751  NA 133* -- 136  K 4.0 -- 4.4  CL 96 -- 95*  CO2 28 -- 31  GLUCOSE 119* -- 133*  BUN 24* -- 25*  CREATININE 1.13 1.16 1.20  CALCIUM 8.5 -- 9.5   LFT  Basename 05/13/11 0503 05/12/11 1751  PROT 7.5 8.9*  ALBUMIN 3.6 4.5  AST 18 21  ALT 11 14  ALKPHOS 88 96  BILITOT 0.5 0.7  BILIDIR -- 0.1  IBILI -- 0.6    Assessment: Right colonic  distention on CT. Clinically much improved. Query early Ogilvie's syndrome. Bowel ischemia would remain in the differential at this time, resolving volvulus. Colonoscopy 11/2007, Dr. Karilyn Cota. Three polyps, two were tubular adenomas. Pancolonic diverticulosis, most in sigmoid area.     Plan: Patient okay for discharge later today from GI standpoint. Recommend f/u with Dr. Karilyn Cota, within next 2-3 weeks. Maintain bowel regimen at home, Miralax 17 grams daily. May take up to BID if no BM in 24 hours.     LOS: 2 days   Tana Coast  05/14/2011, 8:42 AM   Pt tolerating pos. No abd pain or vomiting. BMs: 2 today-1 watery, 2 soft. Agree with above. Will sign off. Call with questions 7/13 1535 slf.

## 2011-05-14 NOTE — Progress Notes (Signed)
NAMEDANILO, CAPPIELLO NO.:  [id number]  MEDICAL RECORD NO.:  [id number]  LOCATION:  A306                          FACILITY:  [facility]  PHYSICIAN:  Kingsley Callander. Ouida Sills, MD       DATE OF BIRTH:  [birth year]  DATE OF PROCEDURE: DATE OF DISCHARGE:                                PROGRESS NOTE   Mr. Minner has been pain free overnight.  He was able to eat his supper last night without difficulty.  He had a good bowel movement yesterday. Has been seen by Gastroenterology.  VITAL SIGNS:  His temperature is 97.7, pulse 72, respirations 20, blood pressure 126/76, oxygen saturation 97% on room air. LUNGS:  Clear. HEART:  Regular. Abdomen:  Soft and nontender with no hepatosplenomegaly.  Bowel sounds are normal. EXTREMITIES:  No edema.  IMPRESSION/PLAN:  Abdominal pain with cecal dilatation by CT.  He is currently asymptomatic.  He has been able to eat and he has been able to have a bowel movement.  We will discuss further with Gastroenterology regarding any additional plans.  He has been on Rocephin and ciprofloxacin, although the current indication to continue these would certainly be unclear.     Kingsley Callander. Ouida Sills, MD     ROF/MEDQ  D:  05/14/2011  T:  05/14/2011  Job:  [id number]

## 2011-05-15 NOTE — Discharge Summary (Signed)
[name], Preston Martinez NO.:  [id number]  MEDICAL RECORD NO.:  [id number]  LOCATION:  A306                          FACILITY:  [facility]  PHYSICIAN:  Kingsley Callander. Ouida Sills, MD       DATE OF BIRTH:  November 21, 1924  DATE OF ADMISSION:  05/12/2011 DATE OF DISCHARGE:  07/13/2012LH                              DISCHARGE SUMMARY   CSN [id number].  DISCHARGE DIAGNOSES: 1. Abdominal pain with cecal dilatation of unclear etiology. 2. History of thyroid carcinoma. 3. Cholelithiasis.  HOSPITAL COURSE:  This patient is an 77 year old male who presented with increasing abdominal pain.  The patient was evaluated in the emergency room and found to have a dilated cecum and ascending colon on CT scan. There was no evidence of any inflammatory process.  He did have cholelithiasis, however, the gallbladder revealed no evidence of cholecystitis.  He initially had a leukocytosis of 14.6.  He was treated with IV Rocephin and ciprofloxacin empirically.  His white count dropped to 9.8.  He had a mildly low serum sodium at 133.  His renal function was normal.  LFTs and lipase were normal.  He experienced no fever.  He was seen in gastroenterology consultation by Dr. Jena Gauss and Dr. Darrick Penna. He was treated with MiraLAX.  His was able to have a bowel movement. His symptoms resolved.  He remained pain free throughout the day and May 14, 2011, he was felt to be stable for discharge in a much improved condition on the evening of May 14, 2011.  The patient will have followup in my office in 4 days.  He will resume his usual medications including levothyroxine 150 mcg daily, lisinopril 20 mg daily, and Nexium 40 mg daily with p.r.n. Advil p.m.     Kingsley Callander. Ouida Sills, MD     ROF/MEDQ  D:  05/14/2011  T:  05/15/2011  Job:  [id number]

## 2011-08-06 LAB — COMPREHENSIVE METABOLIC PANEL
AST: 24 U/L (ref 0–37)
CO2: 25 mEq/L (ref 19–32)
Calcium: 8 mg/dL — ABNORMAL LOW (ref 8.4–10.5)
Creatinine, Ser: 0.99 mg/dL (ref 0.4–1.5)
GFR calc Af Amer: >60 mL/min (ref >60)
GFR calc non Af Amer: >60 mL/min (ref >60)
Total Protein: 6.8 g/dL (ref 6.0–8.3)

## 2011-08-06 LAB — BASIC METABOLIC PANEL
BUN: 13 mg/dL (ref 6–23)
BUN: 13 mg/dL (ref 6–23)
BUN: 13 mg/dL (ref 6–23)
CO2: 23 mEq/L (ref 19–32)
CO2: 25 mEq/L (ref 19–32)
CO2: 27 mEq/L (ref 19–32)
CO2: 30 mEq/L (ref 19–32)
Chloride: 100 mEq/L (ref 96–112)
Chloride: 103 mEq/L (ref 96–112)
Chloride: 104 mEq/L (ref 96–112)
Chloride: 104 mEq/L (ref 96–112)
Chloride: 106 mEq/L (ref 96–112)
Creatinine, Ser: 0.86 mg/dL (ref 0.4–1.5)
Creatinine, Ser: 0.9 mg/dL (ref 0.4–1.5)
GFR calc Af Amer: >60 mL/min (ref >60)
GFR calc Af Amer: >60 mL/min (ref >60)
Glucose, Bld: 123 mg/dL — ABNORMAL HIGH (ref 70–99)
Glucose, Bld: 139 mg/dL — ABNORMAL HIGH (ref 70–99)
Potassium: 3.7 mEq/L (ref 3.5–5.1)
Potassium: 3.7 mEq/L (ref 3.5–5.1)
Potassium: 3.9 mEq/L (ref 3.5–5.1)
Potassium: 4.5 mEq/L (ref 3.5–5.1)
Sodium: 136 mEq/L (ref 135–145)
Sodium: 137 mEq/L (ref 135–145)
Sodium: 140 mEq/L (ref 135–145)

## 2011-08-06 LAB — CALCIUM
Calcium: 7.6 mg/dL — ABNORMAL LOW (ref 8.4–10.5)
Calcium: 7.7 mg/dL — ABNORMAL LOW (ref 8.4–10.5)
Calcium: 7.8 mg/dL — ABNORMAL LOW (ref 8.4–10.5)
Calcium: 7.9 mg/dL — ABNORMAL LOW (ref 8.4–10.5)
Calcium: 7.9 mg/dL — ABNORMAL LOW (ref 8.4–10.5)

## 2011-08-06 LAB — CBC
HCT: 43.1 % (ref 39.0–52.0)
Hemoglobin: 14.9 g/dL (ref 13.0–17.0)
MCV: 90.5 fL (ref 78.0–100.0)
RBC: 4.76 MIL/uL (ref 4.22–5.81)
WBC: 7.7 10*3/uL (ref 4.0–10.5)

## 2011-09-06 ENCOUNTER — Encounter (INDEPENDENT_AMBULATORY_CARE_PROVIDER_SITE_OTHER): Payer: Self-pay | Admitting: *Deleted

## 2011-09-06 ENCOUNTER — Ambulatory Visit (INDEPENDENT_AMBULATORY_CARE_PROVIDER_SITE_OTHER): Payer: Medicare Other | Admitting: Internal Medicine

## 2011-09-06 ENCOUNTER — Other Ambulatory Visit (INDEPENDENT_AMBULATORY_CARE_PROVIDER_SITE_OTHER): Payer: Self-pay | Admitting: *Deleted

## 2011-09-06 ENCOUNTER — Encounter (INDEPENDENT_AMBULATORY_CARE_PROVIDER_SITE_OTHER): Payer: Self-pay | Admitting: Internal Medicine

## 2011-09-06 VITALS — BP 128/50 | Temp 97.6°F | Ht 66.0 in | Wt 157.7 lb

## 2011-09-06 DIAGNOSIS — I10 Essential (primary) hypertension: Secondary | ICD-10-CM | POA: Insufficient documentation

## 2011-09-06 DIAGNOSIS — R1310 Dysphagia, unspecified: Secondary | ICD-10-CM

## 2011-09-06 DIAGNOSIS — R1314 Dysphagia, pharyngoesophageal phase: Secondary | ICD-10-CM

## 2011-09-06 NOTE — Patient Instructions (Signed)
Will schedule a EGD/ED in the near future.
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
## 2011-09-07 ENCOUNTER — Encounter (HOSPITAL_COMMUNITY): Payer: Self-pay | Admitting: Pharmacy Technician

## 2011-09-07 MED ORDER — SODIUM CHLORIDE 0.45 % IV SOLN
Freq: Once | INTRAVENOUS | Status: AC
  Administered 2011-09-08: 1000 mL via INTRAVENOUS

## 2011-09-08 ENCOUNTER — Ambulatory Visit (HOSPITAL_COMMUNITY)
Admission: RE | Admit: 2011-09-08 | Discharge: 2011-09-08 | Disposition: A | Discharge status: Home / Self Care | Payer: Medicare Other | Source: Ambulatory Visit | Attending: Internal Medicine | Admitting: Internal Medicine

## 2011-09-08 ENCOUNTER — Encounter (HOSPITAL_COMMUNITY): Payer: Self-pay | Admitting: *Deleted

## 2011-09-08 ENCOUNTER — Encounter (HOSPITAL_COMMUNITY)
Admission: RE | Disposition: A | Discharge status: Home / Self Care | Payer: Self-pay | Source: Ambulatory Visit | Attending: Internal Medicine

## 2011-09-08 DIAGNOSIS — R1310 Dysphagia, unspecified: Secondary | ICD-10-CM | POA: Insufficient documentation

## 2011-09-08 DIAGNOSIS — I10 Essential (primary) hypertension: Secondary | ICD-10-CM | POA: Insufficient documentation

## 2011-09-08 DIAGNOSIS — K222 Esophageal obstruction: Secondary | ICD-10-CM

## 2011-09-08 DIAGNOSIS — Z79899 Other long term (current) drug therapy: Secondary | ICD-10-CM | POA: Insufficient documentation

## 2011-09-08 DIAGNOSIS — K219 Gastro-esophageal reflux disease without esophagitis: Secondary | ICD-10-CM

## 2011-09-08 DIAGNOSIS — K449 Diaphragmatic hernia without obstruction or gangrene: Secondary | ICD-10-CM | POA: Insufficient documentation

## 2011-09-08 SURGERY — ESOPHAGOGASTRODUODENOSCOPY (EGD) WITH ESOPHAGEAL DILATION
Anesthesia: Moderate Sedation

## 2011-09-08 MED ORDER — BUTAMBEN-TETRACAINE-BENZOCAINE 2-2-14 % EX AERO
INHALATION_SPRAY | CUTANEOUS | Status: DC | PRN
  Administered 2011-09-08: 2 via TOPICAL

## 2011-09-08 MED ORDER — MEPERIDINE HCL 25 MG/ML IJ SOLN
INTRAMUSCULAR | Status: DC | PRN
  Administered 2011-09-08: 15 mg via INTRAVENOUS

## 2011-09-08 MED ORDER — MIDAZOLAM HCL 5 MG/5ML IJ SOLN
INTRAMUSCULAR | Status: DC | PRN
  Administered 2011-09-08: 2 mg via INTRAVENOUS

## 2011-09-08 MED ORDER — MEPERIDINE HCL 50 MG/ML IJ SOLN
INTRAMUSCULAR | Status: AC
  Filled 2011-09-08: qty 1

## 2011-09-08 MED ORDER — MIDAZOLAM HCL 5 MG/5ML IJ SOLN
INTRAMUSCULAR | Status: AC
  Filled 2011-09-08: qty 10

## 2011-09-08 NOTE — H&P (Signed)
This is an up date to history and physical from 09/06/2011. Edition has chronic GERD complicated by distal esophageal stricture. He was last dilated by means 2004. He now presents with few months history of solid food dysphagia and points to lower sternal area as the site of bolus obstruction. He says his heartburn is well controlled with therapy. His medications are symptoms have not changed since his office visit two days.

## 2011-09-08 NOTE — Op Note (Signed)
EGD PROCEDURE REPORT  PATIENT:  Preston Martinez  MR#:  [id number] Birthdate:  18-Nov-1924, 78 y.o., male Endoscopist:  Dr. Malissa Hippo, MD Referred By:  Dr. Carylon Perches, M.D. Procedure Date: 09/08/2011  Procedure:   EGD with ED.  Indications:  Patient is 77 year old Caucasian male with chronic GERD and maintained on PPI who presents with solid food dysphagia of 6 months duration. His last esophageal dilation was in 2004.            Informed Consent: Procedure and risks were reviewed with the patient and informed consent was obtained. Medications:  Demerol 15 mg IV Versed 2 mg IV Cetacaine spray topically for oropharyngeal anesthesia  Description of procedure:  The endoscope was introduced through the mouth and advanced to the second portion of the duodenum without difficulty or limitations. The mucosal surfaces were surveyed very carefully during advancement of the scope and upon withdrawal.  Findings:  Esophagus:  Mucosa of the esophagus was normal. 2 rings noted one at GE junction and the other one just proximal to it. GEJ:  38 cm Hiatus:   40 cm Stomach:  Stomach was empty and distended very well with insufflation. Folds in the proximal stomach were normal. Examination of mucosa at body, antrum, pyloric channel, angularis as well as fundus and cardia was normal. Duodenum:  Normal bulbar and post bulbar mucosa  Therapeutic/Diagnostic Maneuvers Performed:  Esophageal dilation was performed with balloon dilator. Dilator was passed with a scope. Guidewire was pushed into the gastric lumen. Dilator was positioned across the GE junction and insufflated from a diameter of 15-16.5 and finally to 18 mm disrupting the distal ring not the proximal one. Proximal ring was disrupted with a focal biopsy and distal ring was also disrupted focal biopsy.  Complications:  None  Impression: Two esophageal rings; one at GE junction and the second one was proximal to it. Both of these rings were  dilated/disrupted with a combination of balloon dilation as well as focal biopsy; no tissue was saved. Small sliding hiatal hernia.  Recommendations:  Standard instructions given. Patient advised to call us with a progress report in one week.  Angelo Prindle U  09/08/2011  3:51 PM  CC: Dr. Carylon Perches, MD & Dr. Bonnetta Barry ref. provider found

## 2011-09-13 ENCOUNTER — Telehealth (INDEPENDENT_AMBULATORY_CARE_PROVIDER_SITE_OTHER): Payer: Self-pay | Admitting: Internal Medicine

## 2011-09-13 NOTE — Telephone Encounter (Signed)
Was told to call and report. Preston Martinez is doing good. They fell like he is going to need the speach therapy. The return phone number is [phone].

## 2011-09-14 NOTE — Telephone Encounter (Signed)
Will schedule office visit with Ms. Havery Moros SLP

## 2011-09-15 NOTE — Telephone Encounter (Signed)
Order faxed to PT for speech therapy, they will contact patient with appt

## 2011-09-15 NOTE — Telephone Encounter (Signed)
Lm @ PT for return call so I can sch appt for speech therapy

## 2011-09-20 ENCOUNTER — Ambulatory Visit (HOSPITAL_COMMUNITY)
Admission: RE | Admit: 2011-09-20 | Discharge: 2011-09-20 | Disposition: A | Discharge status: Home / Self Care | Payer: Medicare Other | Source: Ambulatory Visit | Attending: Internal Medicine | Admitting: Internal Medicine

## 2011-09-20 DIAGNOSIS — R1310 Dysphagia, unspecified: Secondary | ICD-10-CM | POA: Insufficient documentation

## 2011-09-20 DIAGNOSIS — IMO0001 Reserved for inherently not codable concepts without codable children: Secondary | ICD-10-CM | POA: Insufficient documentation
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
## 2011-09-27 ENCOUNTER — Ambulatory Visit (HOSPITAL_COMMUNITY)
Admission: RE | Admit: 2011-09-27 | Discharge: 2011-09-27 | Disposition: A | Discharge status: Home / Self Care | Payer: Medicare Other | Source: Ambulatory Visit | Attending: Internal Medicine | Admitting: Internal Medicine

## 2011-09-27 NOTE — Progress Notes (Signed)
Speech Language Pathology Treatment Patient Details  Name: Preston Martinez MRN: [id number] Date of Birth: 11-07-1924  Today's Date: 09/27/2011 Time: 4098-1191 Time Calculation (min): 54 min  HPI:  Symptoms/Limitations Symptoms: Preston Martinez was alert and cooperative. Special Tests: N/A Pain Assessment Currently in Pain?: No/denies Multiple Pain Sites: No   Treatment  Voice Therapy Dysphagia Therapy Patient/Family Education Home Exercise Program  SLP Goals  Home Exercise SLP Goal: Patient will Perform Home Exercise Program: Independently SLP Short Term Goals SLP Short Term Goal 1: Pt will complete 4 pharyngeal strengthening exercises x 10 each with min cues. SLP Short Term Goal 1 - Progress: Progressing toward goal SLP Short Term Goal 2: Pt will complete vocal fold adduction exercises x 10 each with min cues. SLP Short Term Goal 2 - Progress: Progressing toward goal SLP Short Term Goal 3: Pt will utilize voice compensatory strategies in structured tasks with min cues. SLP Short Term Goal 3 - Progress: Progressing toward goal SLP Short Term Goal 4: Pt will demonstrate correct execution on the chin tuck strategy on all sips of liquid with min cues. SLP Short Term Goal 4 - Progress: Progressing toward goal SLP Long Term Goals SLP Long Term Goal 1: Safe and efficient consumption of self regulated "regular" diet with thin liquids with use of compensatory strategies. SLP Long Term Goal 1 - Progress: Progressing toward goal SLP Long Term Goal 2: Increase vocal intensity and clarity in short conversations on phone and during direct interactions. SLP Long Term Goal 2 - Progress: Progressing toward goal  Assessment/Plan  Patient Active Problem List  Diagnoses  . Heart murmur  . Hypertension  . Dysphagia   SLP - End of Session Activity Tolerance: Patient tolerated treatment well General Behavior During Session: [facility] for tasks performed Cognition: [facility] for tasks performed  SLP  Assessment/Plan Clinical Impression Statement: Pt reports that he did try to do some of the voice and swallowing exercises presented last week. He intermittantly remembers to use the chin tuck strategy with thin liquids (5/6 during session today), and reports getting "strangled" when he does not utilize it. He questions whether this therapy will really help and I responded to him that it would help, if he put forth the effort and continued to work on it at home after discharge from therapy. Vocal intensity improves tremendously with cues. He will bring his list of exercises on Thursday. I may need to cut exercises down to 3 so that he can easily remember them and complete. Speech Therapy Frequency: min 2x/week Duration: 2 weeks Treatment/Interventions: Aspiration precaution training;Pharyngeal strengthening exercises;Diet toleration management by SLP;Compensatory techniques;SLP instruction and feedback;Patient/family education;Compensatory strategies Potential to Achieve Goals: Good Potential Considerations: Other (comment) (Previous thyroidectomy with removal of a cranial nerve.)  Preston Martinez 09/27/2011, 4:21 PM

## 2011-09-30 ENCOUNTER — Ambulatory Visit (HOSPITAL_COMMUNITY)
Admission: RE | Admit: 2011-09-30 | Discharge: 2011-09-30 | Disposition: A | Discharge status: Home / Self Care | Payer: Medicare Other | Source: Ambulatory Visit | Attending: Internal Medicine | Admitting: Internal Medicine

## 2011-09-30 NOTE — Progress Notes (Signed)
Speech Language Pathology Treatment Patient Details  Name: BUEL MOLDER MRN: [id number] Date of Birth: 03/24/1925  Today's Date: 09/30/2011 Time: 1003-1056 Time Calculation (min): 53 min  HPI:  Symptoms/Limitations Symptoms: Mr. Spagnoli was alert and cooperative. He is complaining of being unable to clear his throat today. Special Tests: N/A Pain Assessment Currently in Pain?: No/denies Multiple Pain Sites: No   Treatment  Dysphagia Therapy Voice Therapy Pharyngeal Strengthening Exercises P.O. Trials/diet tolerance Pt/Family Education Home Exercise Program  SLP Goals  Home Exercise SLP Goal: Patient will Perform Home Exercise Program: Independently SLP Goal: Perform Home Exercise Program - Progress: Progressing toward goal SLP Short Term Goals SLP Short Term Goal 1: Pt will complete 4 pharyngeal strengthening exercises x 10 each with min cues. SLP Short Term Goal 1 - Progress: Progressing toward goal SLP Short Term Goal 2: Pt will complete vocal fold adduction exercises x 10 each with min cues. SLP Short Term Goal 2 - Progress: Progressing toward goal SLP Short Term Goal 3: Pt will utilize voice compensatory strategies in structured tasks with min cues. SLP Short Term Goal 3 - Progress: Progressing toward goal SLP Short Term Goal 4: Pt will demonstrate correct execution on the chin tuck strategy on all sips of liquid with min cues. SLP Short Term Goal 4 - Progress: Progressing toward goal SLP Long Term Goals SLP Long Term Goal 1: Safe and efficient consumption of self regulated "regular" diet with thin liquids with use of compensatory strategies. SLP Long Term Goal 1 - Progress: Progressing toward goal SLP Long Term Goal 2: Increase vocal intensity and clarity in short conversations on phone and during direct interactions. SLP Long Term Goal 2 - Progress: Progressing toward goal  Assessment/Plan  Patient Active Problem List  Diagnoses  . Heart murmur  . Hypertension    . Dysphagia   SLP - End of Session Activity Tolerance: Patient tolerated treatment well General Behavior During Session: [facility] for tasks performed Cognition: [facility] for tasks performed  SLP Assessment/Plan Clinical Impression Statement: Mr. Tavella has been practicing some of his voice and swallowing exercises, but has a difficult time with the [facility]. He is able to do a modified form with his tongue pressed behind his front teeth. Encouraged him to replace vocal abuse patterns (throat clearing and tension in cervical region) with sip of water, hard swallow, and diaphragmatic breathing. He was recorded reading the Grandfather passage and acknowledged that he could barely hear it. Again, with direct cues for loudness and breath support his vocal quality and intensity improves. Speech Therapy Frequency: min 2x/week Duration: 1 week Treatment/Interventions: Aspiration precaution training;Pharyngeal strengthening exercises;Diet toleration management by SLP;Compensatory techniques;SLP instruction and feedback;Patient/family education;Compensatory strategies Potential to Achieve Goals: Good Potential Considerations: Other (comment) (Previous thyroidectomy with removal of a cranial nerve.)  Continue plan of care in preparation for DC next week (likely).  Havery Moros, CCC-SLP Gudrun Axe 09/30/2011, 11:11 AM

## 2011-10-04 ENCOUNTER — Ambulatory Visit (HOSPITAL_COMMUNITY)
Admission: RE | Admit: 2011-10-04 | Discharge: 2011-10-04 | Disposition: A | Discharge status: Home / Self Care | Payer: Medicare Other | Source: Ambulatory Visit | Attending: Internal Medicine | Admitting: Internal Medicine

## 2011-10-04 DIAGNOSIS — IMO0001 Reserved for inherently not codable concepts without codable children: Secondary | ICD-10-CM | POA: Insufficient documentation

## 2011-10-04 DIAGNOSIS — R1310 Dysphagia, unspecified: Secondary | ICD-10-CM | POA: Insufficient documentation

## 2011-10-04 NOTE — Progress Notes (Signed)
Speech Language Pathology Treatment Patient Details  Name: Preston Martinez MRN: [id number] Date of Birth: 02/23/1925  Today's Date: 10/04/2011 Time: 1029-1125 Time Calculation (min): 56 min  HPI:  Symptoms/Limitations Symptoms: Preston Martinez is doing well this morning, alert and cooperative. Special Tests: N/A Pain Assessment Currently in Pain?: No/denies Multiple Pain Sites: No  Assessments  N/A  Treatment  Voice Therapy Dysphagia Therapy Oral Motor Exercises Patient/Family Education Home Exercise Program  SLP Goals  Home Exercise SLP Goal: Patient will Perform Home Exercise Program: Independently SLP Goal: Perform Home Exercise Program - Progress: Progressing toward goal SLP Short Term Goals SLP Short Term Goal 1: Pt will complete 4 pharyngeal strengthening exercises x 10 each with min cues. SLP Short Term Goal 1 - Progress: Progressing toward goal SLP Short Term Goal 2: Pt will complete vocal fold adduction exercises x 10 each with min cues. SLP Short Term Goal 2 - Progress: Progressing toward goal SLP Short Term Goal 3: Pt will utilize voice compensatory strategies in structured tasks with min cues. SLP Short Term Goal 3 - Progress: Progressing toward goal SLP Short Term Goal 4: Pt will demonstrate correct execution on the chin tuck strategy on all sips of liquid with min cues. SLP Short Term Goal 4 - Progress: Progressing toward goal SLP Long Term Goals SLP Long Term Goal 1: Safe and efficient consumption of self regulated "regular" diet with thin liquids with use of compensatory strategies. SLP Long Term Goal 1 - Progress: Progressing toward goal SLP Long Term Goal 2: Increase vocal intensity and clarity in short conversations on phone and during direct interactions. SLP Long Term Goal 2 - Progress: Progressing toward goal  Assessment/Plan  Patient Active Problem List  Diagnoses  . Heart murmur  . Hypertension  . Dysphagia   SLP - End of Session Activity  Tolerance: Patient tolerated treatment well General Behavior During Session: [facility] for tasks performed Cognition: [facility] for tasks performed  SLP Assessment/Plan Clinical Impression Statement: Preston Martinez admits that he is not completing his voice and swallowing exercises as much as he should. He states that choking episodes are very infrequent with the use of the chin tuck strategy. His vocal quality is breathy and hoarse and he states that it usually clears up some throughout the day. I will see him in the afternoon on Thursday to see if there is a difference. He benefits from vocal warm-up exercises and breathing exercises to decrease tension in the larynx. He continues to exhibit some vocal abuse patterns that will be very hard for him to correct (ie. harsh throat clearing). Vocal quality and intelligibility imporves with cues for breath support and loudness. Speech Therapy Frequency: min 2x/week Duration: 1 week Treatment/Interventions: Aspiration precaution training;Pharyngeal strengthening exercises;Diet toleration management by SLP;Compensatory techniques;SLP instruction and feedback;Patient/family education;Compensatory strategies Potential to Achieve Goals: Good Potential Considerations: Other (comment) (Previous thyroidectomy with removal of a cranial nerve.)  Preston Martinez 10/04/2011, 1:48 PM

## 2011-10-07 ENCOUNTER — Ambulatory Visit (HOSPITAL_COMMUNITY)
Admission: RE | Admit: 2011-10-07 | Discharge: 2011-10-07 | Disposition: A | Discharge status: Home / Self Care | Payer: Medicare Other | Source: Ambulatory Visit | Attending: Internal Medicine | Admitting: Internal Medicine

## 2011-10-07 ENCOUNTER — Ambulatory Visit (HOSPITAL_COMMUNITY): Payer: Medicare Other | Admitting: Speech Pathology

## 2011-10-07 NOTE — Progress Notes (Signed)
Speech Language Pathology Treatment/Discharge Summary Patient Details  Name: KREW HORTMAN MRN: [id number] Date of Birth: 1925-04-12  Today's Date: 10/07/2011 Time: 1301-1340 Time Calculation (min): 39 min  HPI:  Symptoms/Limitations Symptoms: Mr. Hensen is doing well. Special Tests: N/A Pain Assessment Currently in Pain?: No/denies Multiple Pain Sites: No   Treatment  Voice Therapy Dysphagia Therapy Patient/Family Education Home Exercise Program  SLP Goals  Home Exercise SLP Goal: Patient will Perform Home Exercise Program: Independently SLP Goal: Perform Home Exercise Program - Progress: Met SLP Short Term Goals SLP Short Term Goal 1: Pt will complete 4 pharyngeal strengthening exercises x 10 each with min cues. SLP Short Term Goal 1 - Progress: Met SLP Short Term Goal 2: Pt will complete vocal fold adduction exercises x 10 each with min cues. SLP Short Term Goal 2 - Progress: Met SLP Short Term Goal 3: Pt will utilize voice compensatory strategies in structured tasks with min cues. SLP Short Term Goal 3 - Progress: Met SLP Short Term Goal 4: Pt will demonstrate correct execution on the chin tuck strategy on all sips of liquid with min cues. SLP Short Term Goal 4 - Progress: Met SLP Long Term Goals SLP Long Term Goal 1: Safe and efficient consumption of self regulated "regular" diet with thin liquids with use of compensatory strategies. SLP Long Term Goal 1 - Progress: Met SLP Long Term Goal 2: Increase vocal intensity and clarity in short conversations on phone and during direct interactions. SLP Long Term Goal 2 - Progress: Partly met  Assessment/Plan  Patient Active Problem List  Diagnoses  . Heart murmur  . Hypertension  . Dysphagia   SLP - End of Session Activity Tolerance: Patient tolerated treatment well General Behavior During Session: [facility] for tasks performed Cognition: [facility] for tasks performed  SLP Assessment/Plan Clinical Impression Statement: All  exercises were reviewed with patient and he reports that he will continue to implement them at home. Vocal quality tends to improve throughout the day once he has sufficiently "cleared it". He was encouraged to replace the chronic clearing with a hard swallow and/or sip of water, but not sure this habit can be changed. He says that he takes his PPI in the am and I suggested that he talk to his doctor about switching it to pm to see if that helps his vocal quality in the am. Mr. Rister is able to complete the voice and swallowing exercises with written cues and he will be discharged from speech therapy today to home program. He was instructed to notify his MD if his swallow function decreases. Duration: 1 week Treatment/Interventions: Aspiration precaution training;Pharyngeal strengthening exercises;Diet toleration management by SLP;Compensatory techniques;SLP instruction and feedback;Patient/family education;Compensatory strategies Potential to Achieve Goals: Good Potential Considerations: Other (comment) (Previous thyroidectomy with removal of a cranial nerve.)  PORTER,DABNEY 10/07/2011, 6:05 PM

## 2012-05-10 DIAGNOSIS — Z79899 Other long term (current) drug therapy: Secondary | ICD-10-CM | POA: Diagnosis not present

## 2012-05-10 DIAGNOSIS — R7301 Impaired fasting glucose: Secondary | ICD-10-CM | POA: Diagnosis not present

## 2012-05-10 DIAGNOSIS — Z125 Encounter for screening for malignant neoplasm of prostate: Secondary | ICD-10-CM | POA: Diagnosis not present

## 2012-05-10 DIAGNOSIS — I10 Essential (primary) hypertension: Secondary | ICD-10-CM | POA: Diagnosis not present

## 2012-05-18 DIAGNOSIS — C73 Malignant neoplasm of thyroid gland: Secondary | ICD-10-CM | POA: Diagnosis not present

## 2012-05-18 DIAGNOSIS — K219 Gastro-esophageal reflux disease without esophagitis: Secondary | ICD-10-CM | POA: Diagnosis not present

## 2012-05-18 DIAGNOSIS — I10 Essential (primary) hypertension: Secondary | ICD-10-CM | POA: Diagnosis not present

## 2012-05-18 DIAGNOSIS — Z1212 Encounter for screening for malignant neoplasm of rectum: Secondary | ICD-10-CM | POA: Diagnosis not present

## 2012-06-21 IMAGING — CT CT ABD-PELV W/ CM
2 of 5 series · 15 of 46 positions shown, 17 images · IV contrast (Omnipaque 300)
Comparison: None available.

CLINICAL DATA: Abdominal pain.  Burning epigastric pain.  Left
flank pain.

CT ABDOMEN AND PELVIS WITH CONTRAST
TECHNIQUE: Multidetector CT imaging of the abdomen and pelvis was
performed following the standard protocol during bolus
administration of intravenous contrast.
Contrast: 100 ml Omnipaque 300.

[Series 2: abd_pel_with 5.0 b40f · axial · 0.70mm/px · z∈[-414,-10]mm · 12 of 93 slices shown, 14 images]
[im 6/93  soft-tissue]
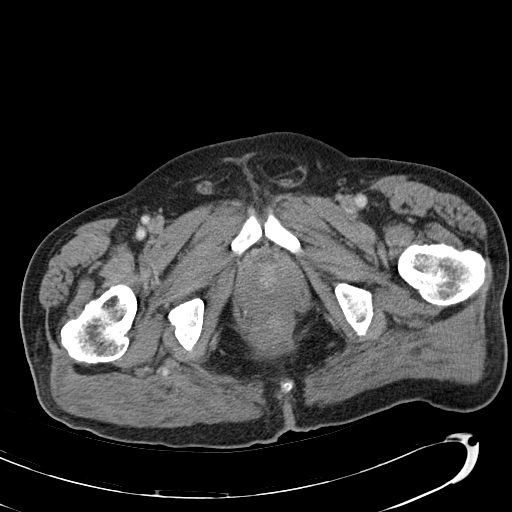
[im 6/93  bone]
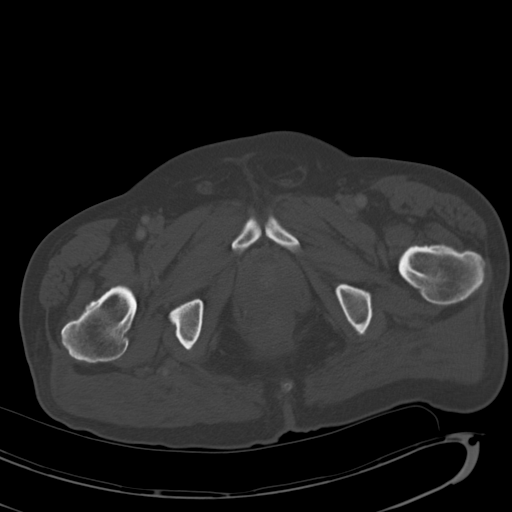
[im 12/93  soft-tissue]
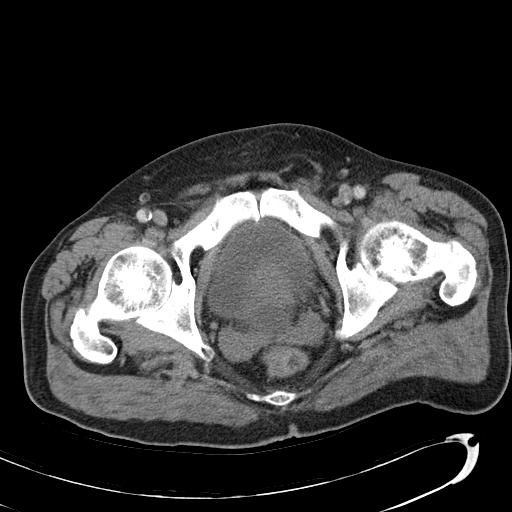
[im 24/93  soft-tissue]
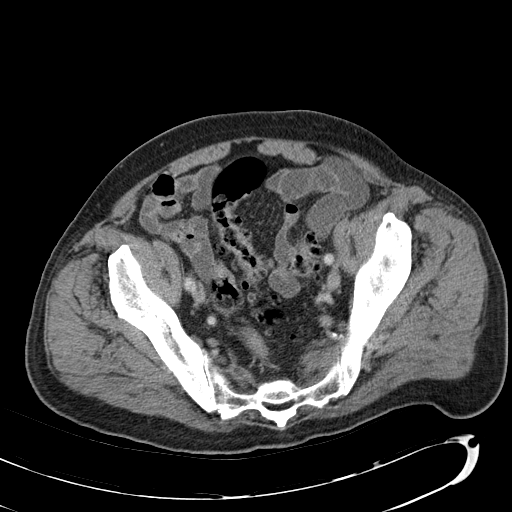
[im 29/93  soft-tissue]
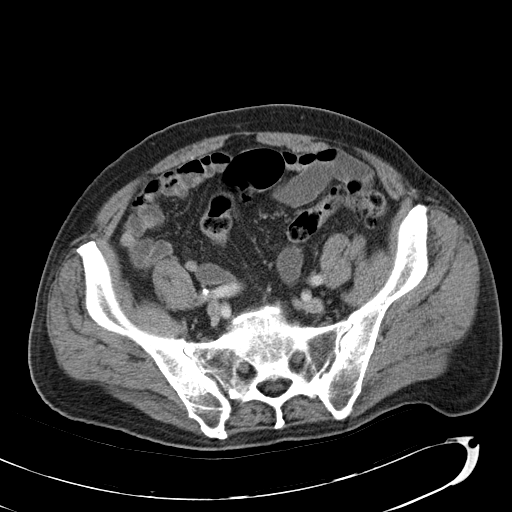
[im 35/93  soft-tissue]
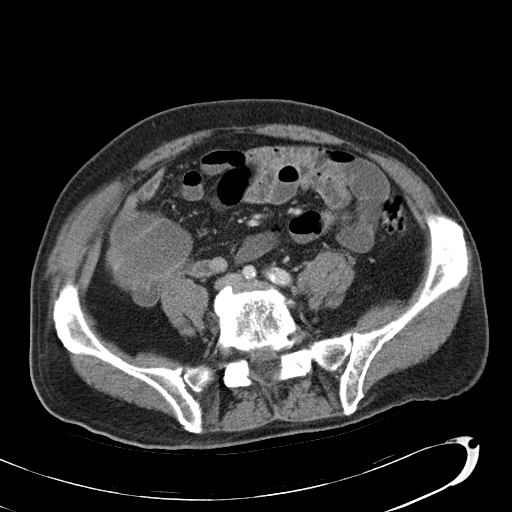
[im 41/93  soft-tissue]
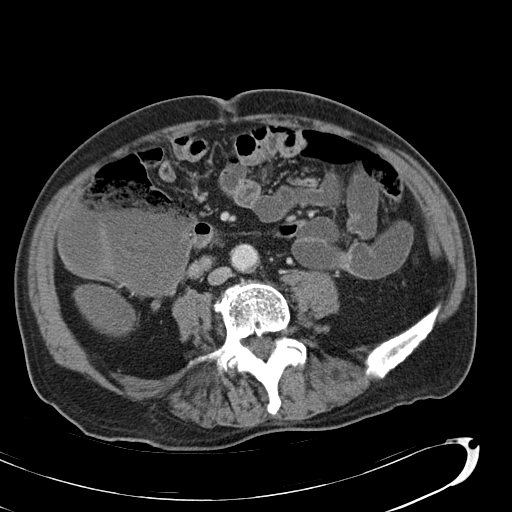
[im 52/93  soft-tissue]
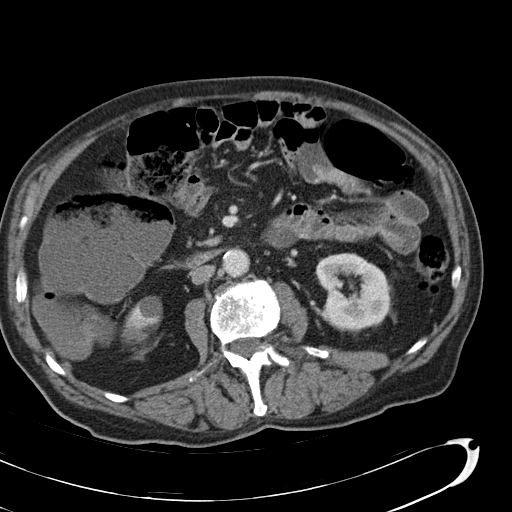
[im 58/93  soft-tissue]
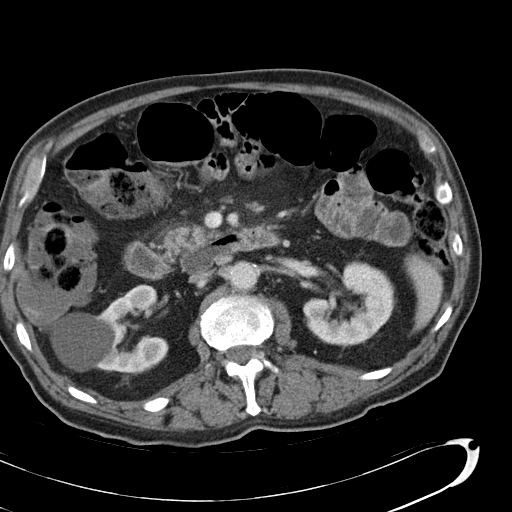
[im 64/93  soft-tissue]
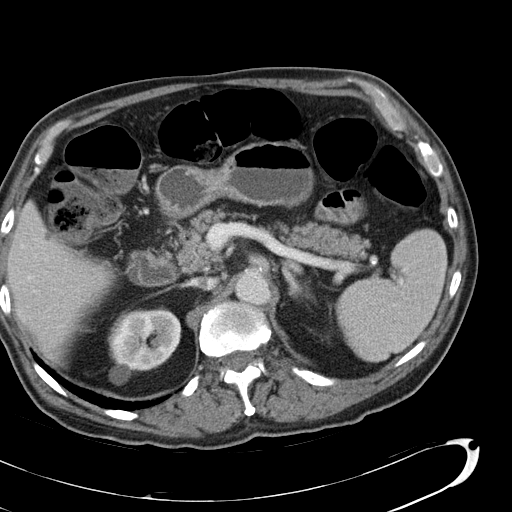
[im 64/93  bone]
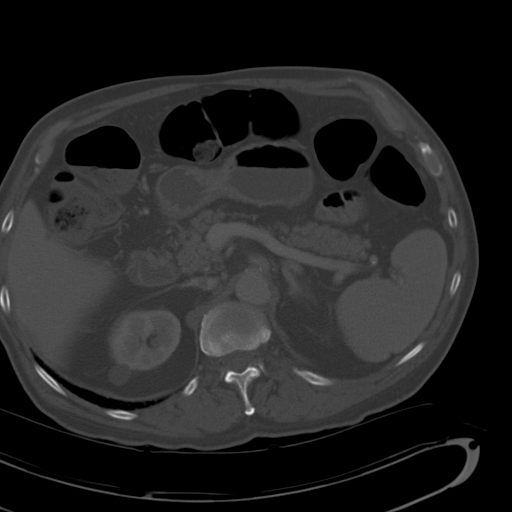
[im 70/93  soft-tissue]
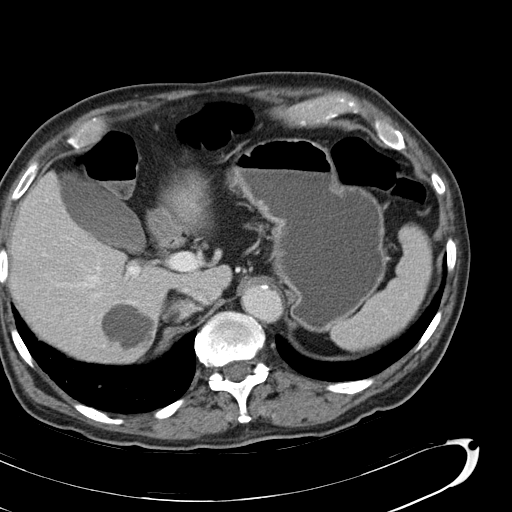
[im 81/93  soft-tissue]
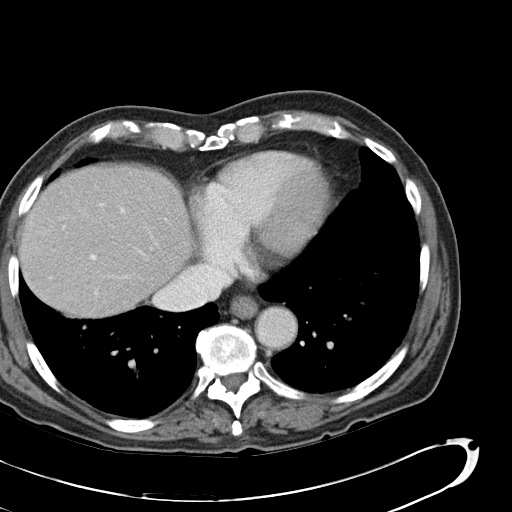
[im 87/93  soft-tissue]
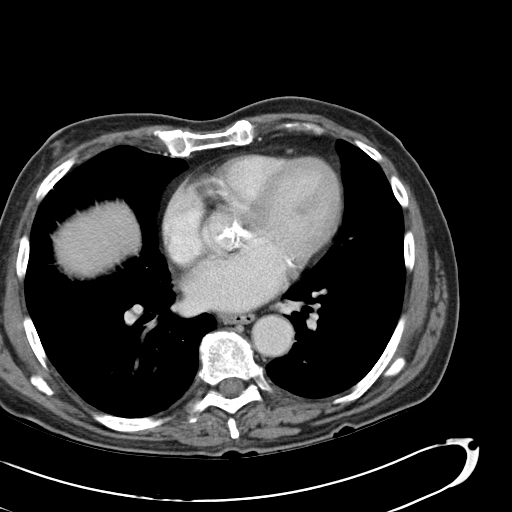

[Series 5: abd_pel_with 3.0 spo · coronal · 0.66mm/px · 3 of 86 slices shown]
[im 29/86  soft-tissue]
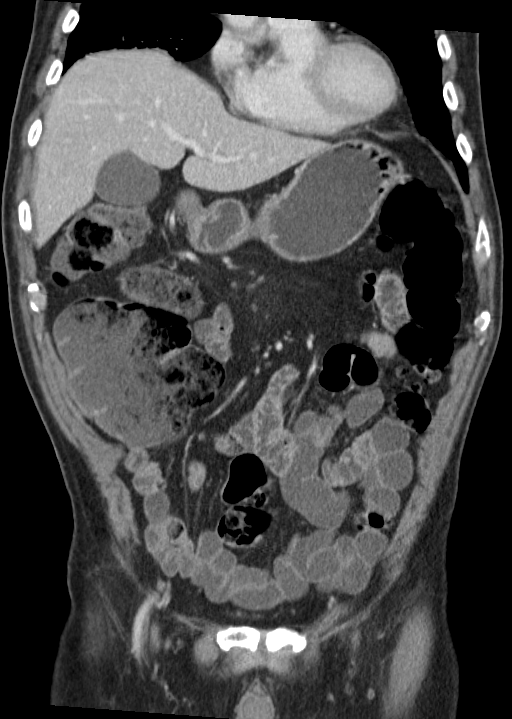
[im 38/86  soft-tissue]
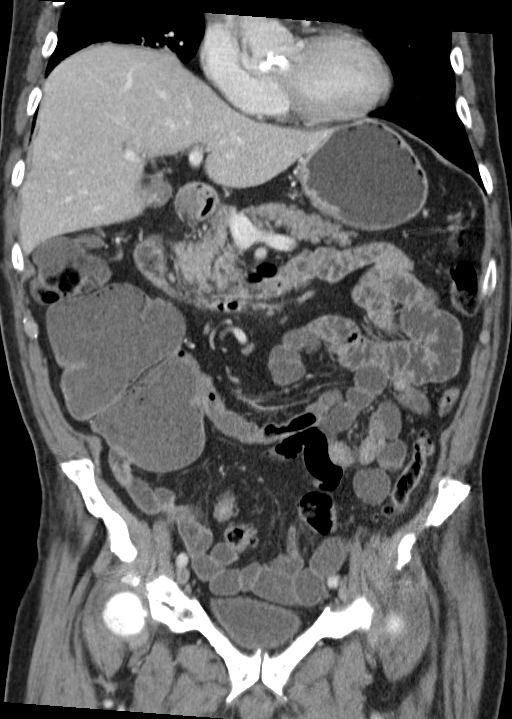
[im 48/86  soft-tissue]
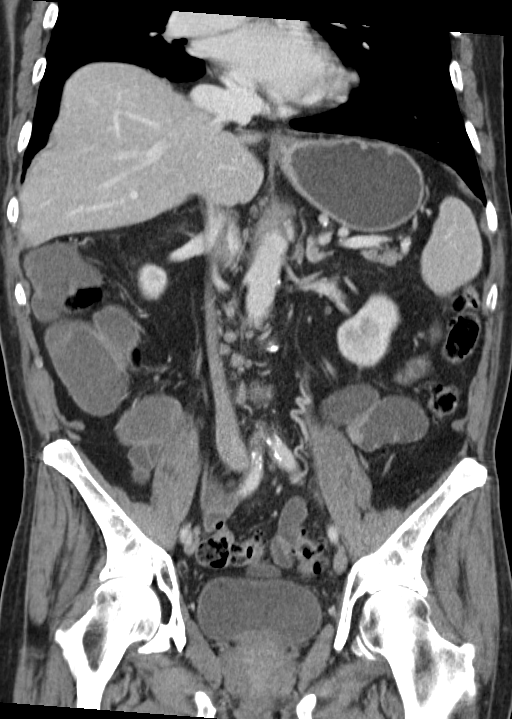

[15 of 46 positions shown; findings below may reference images not displayed]

FINDINGS: The lung bases demonstrate mild emphysematous change.  No
focal airspace consolidation is present.  The heart is mildly
enlarged.  Atherosclerotic calcifications are noted.

Benign cystic lesions are present within both the right and left
lobes of the liver.  No other focal lesions are present.  The
spleen is within normal limits.  A small hiatal hernia is present.
The stomach, duodenum, and pancreas are otherwise within normal
limits.  The common bile duct is normal.  Small stones are layering
in the gallbladder.  No gallbladder wall thickening is present.
Exophytic cysts are present in the right kidney.  Sub centimeter
exophytic cysts are seen within the left kidney.  A benign-
appearing exophytic cyst at the lower pole of the left kidney
measures 1.9 cm.

Diverticular changes are present in the sigmoid colon without focal
inflammation to suggest diverticulitis.  Additional diverticular
changes are present within the descending and distal transverse
colon.  Gas and stool are present within the proximal transverse
colon.  There is fluid distention of the cecum which measures up to
10.5 cm proximally.  No obstructing lesion is evident.  The small
bowel is unremarkable.  Atherosclerotic calcifications are present
in the aorta and branch vessels without aneurysm.  No significant
free fluid or adenopathy is present.  There is heterogeneous
enhancement and calcification of the prostate gland which measures
5.9 cm maximally.  The urinary bladder is within normal limits.
Fat is herniated into the left inguinal canal.  There is no
associated bowel.

A vacuum disc phenomena are present at L3-4 and L4-5.  There is
fusion across the disc space at L5-S1.  No focal lytic or blastic
lesions are evident.
IMPRESSION: 1.  Moderate distention of the cecum and ascending colon without a
discrete obstructing mass.
2.  Moderate descending and sigmoid diverticulosis without
diverticulitis.
3.  Cystic lesions of the liver and kidneys appear benign.

4.  Atherosclerosis.
5.  Moderate spondylosis of the lumbar spine
6.  Mild cardiomegaly without failure.
7.  Coronary artery calcifications.
8.  Cholelithiasis without evidence for cholecystitis.

## 2012-07-04 DIAGNOSIS — C73 Malignant neoplasm of thyroid gland: Secondary | ICD-10-CM | POA: Diagnosis not present

## 2012-07-07 DIAGNOSIS — E039 Hypothyroidism, unspecified: Secondary | ICD-10-CM | POA: Diagnosis not present

## 2012-07-07 DIAGNOSIS — C73 Malignant neoplasm of thyroid gland: Secondary | ICD-10-CM | POA: Diagnosis not present

## 2012-07-10 DIAGNOSIS — H2510 Age-related nuclear cataract, unspecified eye: Secondary | ICD-10-CM | POA: Diagnosis not present

## 2012-07-10 DIAGNOSIS — H5200 Hypermetropia, unspecified eye: Secondary | ICD-10-CM | POA: Diagnosis not present

## 2012-07-10 DIAGNOSIS — H353190 Nonexudative age-related macular degeneration, unspecified eye, stage unspecified: Secondary | ICD-10-CM | POA: Diagnosis not present

## 2012-07-10 DIAGNOSIS — H52229 Regular astigmatism, unspecified eye: Secondary | ICD-10-CM | POA: Diagnosis not present

## 2012-09-18 DIAGNOSIS — Z23 Encounter for immunization: Secondary | ICD-10-CM | POA: Diagnosis not present

## 2012-11-09 ENCOUNTER — Encounter (INDEPENDENT_AMBULATORY_CARE_PROVIDER_SITE_OTHER): Payer: Self-pay

## 2012-11-20 DIAGNOSIS — I10 Essential (primary) hypertension: Secondary | ICD-10-CM | POA: Diagnosis not present

## 2012-11-20 DIAGNOSIS — M25579 Pain in unspecified ankle and joints of unspecified foot: Secondary | ICD-10-CM | POA: Diagnosis not present

## 2013-04-24 DIAGNOSIS — I10 Essential (primary) hypertension: Secondary | ICD-10-CM | POA: Diagnosis not present

## 2013-04-24 DIAGNOSIS — R7301 Impaired fasting glucose: Secondary | ICD-10-CM | POA: Diagnosis not present

## 2013-04-24 DIAGNOSIS — Z79899 Other long term (current) drug therapy: Secondary | ICD-10-CM | POA: Diagnosis not present

## 2013-05-07 DIAGNOSIS — Z79899 Other long term (current) drug therapy: Secondary | ICD-10-CM | POA: Diagnosis not present

## 2013-05-07 DIAGNOSIS — I10 Essential (primary) hypertension: Secondary | ICD-10-CM | POA: Diagnosis not present

## 2013-05-07 DIAGNOSIS — R7301 Impaired fasting glucose: Secondary | ICD-10-CM | POA: Diagnosis not present

## 2013-05-22 DIAGNOSIS — C73 Malignant neoplasm of thyroid gland: Secondary | ICD-10-CM | POA: Diagnosis not present

## 2013-05-22 DIAGNOSIS — E875 Hyperkalemia: Secondary | ICD-10-CM | POA: Diagnosis not present

## 2013-05-22 DIAGNOSIS — I10 Essential (primary) hypertension: Secondary | ICD-10-CM | POA: Diagnosis not present

## 2013-06-28 DIAGNOSIS — R7301 Impaired fasting glucose: Secondary | ICD-10-CM | POA: Diagnosis not present

## 2013-06-28 DIAGNOSIS — I10 Essential (primary) hypertension: Secondary | ICD-10-CM | POA: Diagnosis not present

## 2013-06-28 DIAGNOSIS — Z79899 Other long term (current) drug therapy: Secondary | ICD-10-CM | POA: Diagnosis not present

## 2013-07-05 ENCOUNTER — Other Ambulatory Visit (HOSPITAL_COMMUNITY): Payer: Self-pay | Admitting: Internal Medicine

## 2013-07-05 ENCOUNTER — Ambulatory Visit (HOSPITAL_COMMUNITY)
Admission: RE | Admit: 2013-07-05 | Discharge: 2013-07-05 | Disposition: A | Discharge status: Home / Self Care | Payer: Medicare Other | Source: Ambulatory Visit | Attending: Internal Medicine | Admitting: Internal Medicine

## 2013-07-05 DIAGNOSIS — M5137 Other intervertebral disc degeneration, lumbosacral region: Secondary | ICD-10-CM | POA: Diagnosis not present

## 2013-07-05 DIAGNOSIS — M51379 Other intervertebral disc degeneration, lumbosacral region without mention of lumbar back pain or lower extremity pain: Secondary | ICD-10-CM | POA: Insufficient documentation

## 2013-07-05 DIAGNOSIS — M545 Low back pain, unspecified: Secondary | ICD-10-CM | POA: Diagnosis not present

## 2013-07-05 DIAGNOSIS — M25552 Pain in left hip: Secondary | ICD-10-CM

## 2013-07-05 DIAGNOSIS — M47817 Spondylosis without myelopathy or radiculopathy, lumbosacral region: Secondary | ICD-10-CM | POA: Diagnosis not present

## 2013-07-05 DIAGNOSIS — M25559 Pain in unspecified hip: Secondary | ICD-10-CM | POA: Insufficient documentation

## 2013-07-05 DIAGNOSIS — E875 Hyperkalemia: Secondary | ICD-10-CM | POA: Diagnosis not present

## 2013-07-05 DIAGNOSIS — M169 Osteoarthritis of hip, unspecified: Secondary | ICD-10-CM | POA: Diagnosis not present

## 2013-07-30 ENCOUNTER — Ambulatory Visit: Payer: Medicare Other | Admitting: Sports Medicine

## 2013-08-22 DIAGNOSIS — Z23 Encounter for immunization: Secondary | ICD-10-CM | POA: Diagnosis not present

## 2013-09-26 DIAGNOSIS — J189 Pneumonia, unspecified organism: Secondary | ICD-10-CM | POA: Diagnosis not present

## 2014-02-04 DIAGNOSIS — L2089 Other atopic dermatitis: Secondary | ICD-10-CM | POA: Diagnosis not present

## 2014-04-18 DIAGNOSIS — F329 Major depressive disorder, single episode, unspecified: Secondary | ICD-10-CM | POA: Diagnosis not present

## 2014-04-18 DIAGNOSIS — M545 Low back pain, unspecified: Secondary | ICD-10-CM | POA: Diagnosis not present

## 2014-04-18 DIAGNOSIS — F3289 Other specified depressive episodes: Secondary | ICD-10-CM | POA: Diagnosis not present

## 2014-05-27 DIAGNOSIS — K2100 Gastro-esophageal reflux disease with esophagitis, without bleeding: Secondary | ICD-10-CM | POA: Diagnosis not present

## 2014-05-27 DIAGNOSIS — Z79899 Other long term (current) drug therapy: Secondary | ICD-10-CM | POA: Diagnosis not present

## 2014-05-27 DIAGNOSIS — I10 Essential (primary) hypertension: Secondary | ICD-10-CM | POA: Diagnosis not present

## 2014-05-27 DIAGNOSIS — R7301 Impaired fasting glucose: Secondary | ICD-10-CM | POA: Diagnosis not present

## 2014-06-03 DIAGNOSIS — Z0000 Encounter for general adult medical examination without abnormal findings: Secondary | ICD-10-CM | POA: Diagnosis not present

## 2014-06-28 DIAGNOSIS — Z79899 Other long term (current) drug therapy: Secondary | ICD-10-CM | POA: Diagnosis not present

## 2014-06-28 DIAGNOSIS — E039 Hypothyroidism, unspecified: Secondary | ICD-10-CM | POA: Diagnosis not present

## 2014-07-05 DIAGNOSIS — E039 Hypothyroidism, unspecified: Secondary | ICD-10-CM | POA: Diagnosis not present

## 2014-07-05 DIAGNOSIS — F329 Major depressive disorder, single episode, unspecified: Secondary | ICD-10-CM | POA: Diagnosis not present

## 2014-07-05 DIAGNOSIS — F3289 Other specified depressive episodes: Secondary | ICD-10-CM | POA: Diagnosis not present

## 2014-08-22 DIAGNOSIS — Z23 Encounter for immunization: Secondary | ICD-10-CM | POA: Diagnosis not present

## 2014-10-17 DIAGNOSIS — G4700 Insomnia, unspecified: Secondary | ICD-10-CM | POA: Diagnosis not present

## 2014-10-17 DIAGNOSIS — Z23 Encounter for immunization: Secondary | ICD-10-CM | POA: Diagnosis not present

## 2014-10-17 DIAGNOSIS — I10 Essential (primary) hypertension: Secondary | ICD-10-CM | POA: Diagnosis not present

## 2014-10-17 DIAGNOSIS — F339 Major depressive disorder, recurrent, unspecified: Secondary | ICD-10-CM | POA: Diagnosis not present

## 2014-12-03 DIAGNOSIS — M25561 Pain in right knee: Secondary | ICD-10-CM | POA: Diagnosis not present

## 2015-01-21 DIAGNOSIS — I10 Essential (primary) hypertension: Secondary | ICD-10-CM | POA: Diagnosis not present

## 2015-01-21 DIAGNOSIS — R269 Unspecified abnormalities of gait and mobility: Secondary | ICD-10-CM | POA: Diagnosis not present

## 2015-02-20 ENCOUNTER — Telehealth (HOSPITAL_COMMUNITY): Payer: Self-pay | Admitting: Physical Therapy

## 2015-02-20 ENCOUNTER — Ambulatory Visit (HOSPITAL_COMMUNITY): Payer: Medicare Other | Admitting: Physical Therapy
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
## 2015-03-18 DIAGNOSIS — M545 Low back pain: Secondary | ICD-10-CM | POA: Diagnosis not present

## 2015-03-18 DIAGNOSIS — G894 Chronic pain syndrome: Secondary | ICD-10-CM | POA: Diagnosis not present

## 2015-03-18 DIAGNOSIS — M5137 Other intervertebral disc degeneration, lumbosacral region: Secondary | ICD-10-CM | POA: Diagnosis not present

## 2015-03-18 DIAGNOSIS — M4806 Spinal stenosis, lumbar region: Secondary | ICD-10-CM | POA: Diagnosis not present

## 2015-04-25 DIAGNOSIS — R269 Unspecified abnormalities of gait and mobility: Secondary | ICD-10-CM | POA: Diagnosis not present

## 2015-04-25 DIAGNOSIS — Z9181 History of falling: Secondary | ICD-10-CM | POA: Diagnosis not present

## 2015-05-15 DIAGNOSIS — E890 Postprocedural hypothyroidism: Secondary | ICD-10-CM | POA: Diagnosis not present

## 2015-05-15 DIAGNOSIS — R2681 Unsteadiness on feet: Secondary | ICD-10-CM | POA: Diagnosis not present

## 2015-05-15 DIAGNOSIS — R296 Repeated falls: Secondary | ICD-10-CM | POA: Diagnosis not present

## 2015-05-15 DIAGNOSIS — Z85850 Personal history of malignant neoplasm of thyroid: Secondary | ICD-10-CM | POA: Diagnosis not present

## 2015-05-15 DIAGNOSIS — I10 Essential (primary) hypertension: Secondary | ICD-10-CM | POA: Diagnosis not present

## 2015-05-15 DIAGNOSIS — Z87891 Personal history of nicotine dependence: Secondary | ICD-10-CM | POA: Diagnosis not present

## 2015-05-16 ENCOUNTER — Encounter: Payer: Self-pay | Admitting: *Deleted

## 2015-05-19 ENCOUNTER — Ambulatory Visit (INDEPENDENT_AMBULATORY_CARE_PROVIDER_SITE_OTHER): Payer: Medicare Other | Admitting: Diagnostic Neuroimaging

## 2015-05-19 ENCOUNTER — Encounter: Payer: Self-pay | Admitting: Diagnostic Neuroimaging

## 2015-05-19 VITALS — BP 135/68 | HR 67 | Ht 65.0 in | Wt 150.0 lb

## 2015-05-19 DIAGNOSIS — G20 Parkinson's disease: Secondary | ICD-10-CM

## 2015-05-19 MED ORDER — CARBIDOPA-LEVODOPA 25-100 MG PO TABS: 1.0000 | ORAL_TABLET | Freq: Three times a day (TID) | ORAL | Status: DC

## 2015-05-19 NOTE — Patient Instructions (Signed)
Start carbidopa-levodopa 25/100 half tab three times per day 30 minutes before meals; after 2 weeks, increase to 1 tab three times per day.  Caution with balance and walking; use walker.
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
## 2015-05-20 DIAGNOSIS — Z87891 Personal history of nicotine dependence: Secondary | ICD-10-CM | POA: Diagnosis not present

## 2015-05-20 DIAGNOSIS — I10 Essential (primary) hypertension: Secondary | ICD-10-CM | POA: Diagnosis not present

## 2015-05-20 DIAGNOSIS — R296 Repeated falls: Secondary | ICD-10-CM | POA: Diagnosis not present

## 2015-05-20 DIAGNOSIS — Z85850 Personal history of malignant neoplasm of thyroid: Secondary | ICD-10-CM | POA: Diagnosis not present

## 2015-05-20 DIAGNOSIS — E890 Postprocedural hypothyroidism: Secondary | ICD-10-CM | POA: Diagnosis not present

## 2015-05-20 DIAGNOSIS — R2681 Unsteadiness on feet: Secondary | ICD-10-CM | POA: Diagnosis not present

## 2015-05-22 DIAGNOSIS — R296 Repeated falls: Secondary | ICD-10-CM | POA: Diagnosis not present

## 2015-05-22 DIAGNOSIS — R2681 Unsteadiness on feet: Secondary | ICD-10-CM | POA: Diagnosis not present

## 2015-05-22 DIAGNOSIS — Z85850 Personal history of malignant neoplasm of thyroid: Secondary | ICD-10-CM | POA: Diagnosis not present

## 2015-05-22 DIAGNOSIS — I10 Essential (primary) hypertension: Secondary | ICD-10-CM | POA: Diagnosis not present

## 2015-05-22 DIAGNOSIS — Z87891 Personal history of nicotine dependence: Secondary | ICD-10-CM | POA: Diagnosis not present

## 2015-05-22 DIAGNOSIS — E890 Postprocedural hypothyroidism: Secondary | ICD-10-CM | POA: Diagnosis not present

## 2015-05-27 DIAGNOSIS — E890 Postprocedural hypothyroidism: Secondary | ICD-10-CM | POA: Diagnosis not present

## 2015-05-27 DIAGNOSIS — R296 Repeated falls: Secondary | ICD-10-CM | POA: Diagnosis not present

## 2015-05-27 DIAGNOSIS — Z85850 Personal history of malignant neoplasm of thyroid: Secondary | ICD-10-CM | POA: Diagnosis not present

## 2015-05-27 DIAGNOSIS — I10 Essential (primary) hypertension: Secondary | ICD-10-CM | POA: Diagnosis not present

## 2015-05-27 DIAGNOSIS — Z87891 Personal history of nicotine dependence: Secondary | ICD-10-CM | POA: Diagnosis not present

## 2015-05-27 DIAGNOSIS — R2681 Unsteadiness on feet: Secondary | ICD-10-CM | POA: Diagnosis not present

## 2015-05-29 DIAGNOSIS — R296 Repeated falls: Secondary | ICD-10-CM | POA: Diagnosis not present

## 2015-05-29 DIAGNOSIS — R2681 Unsteadiness on feet: Secondary | ICD-10-CM | POA: Diagnosis not present

## 2015-05-29 DIAGNOSIS — Z87891 Personal history of nicotine dependence: Secondary | ICD-10-CM | POA: Diagnosis not present

## 2015-05-29 DIAGNOSIS — Z85850 Personal history of malignant neoplasm of thyroid: Secondary | ICD-10-CM | POA: Diagnosis not present

## 2015-05-29 DIAGNOSIS — I10 Essential (primary) hypertension: Secondary | ICD-10-CM | POA: Diagnosis not present

## 2015-05-29 DIAGNOSIS — E890 Postprocedural hypothyroidism: Secondary | ICD-10-CM | POA: Diagnosis not present

## 2015-06-03 DIAGNOSIS — I10 Essential (primary) hypertension: Secondary | ICD-10-CM | POA: Diagnosis not present

## 2015-06-03 DIAGNOSIS — Z87891 Personal history of nicotine dependence: Secondary | ICD-10-CM | POA: Diagnosis not present

## 2015-06-03 DIAGNOSIS — E890 Postprocedural hypothyroidism: Secondary | ICD-10-CM | POA: Diagnosis not present

## 2015-06-03 DIAGNOSIS — Z85850 Personal history of malignant neoplasm of thyroid: Secondary | ICD-10-CM | POA: Diagnosis not present

## 2015-06-03 DIAGNOSIS — R2681 Unsteadiness on feet: Secondary | ICD-10-CM | POA: Diagnosis not present

## 2015-06-03 DIAGNOSIS — R296 Repeated falls: Secondary | ICD-10-CM | POA: Diagnosis not present

## 2015-06-05 DIAGNOSIS — R296 Repeated falls: Secondary | ICD-10-CM | POA: Diagnosis not present

## 2015-06-05 DIAGNOSIS — Z85850 Personal history of malignant neoplasm of thyroid: Secondary | ICD-10-CM | POA: Diagnosis not present

## 2015-06-05 DIAGNOSIS — R2681 Unsteadiness on feet: Secondary | ICD-10-CM | POA: Diagnosis not present

## 2015-06-05 DIAGNOSIS — E890 Postprocedural hypothyroidism: Secondary | ICD-10-CM | POA: Diagnosis not present

## 2015-06-05 DIAGNOSIS — I10 Essential (primary) hypertension: Secondary | ICD-10-CM | POA: Diagnosis not present

## 2015-06-05 DIAGNOSIS — Z87891 Personal history of nicotine dependence: Secondary | ICD-10-CM | POA: Diagnosis not present

## 2015-06-06 DIAGNOSIS — Z79899 Other long term (current) drug therapy: Secondary | ICD-10-CM | POA: Diagnosis not present

## 2015-06-06 DIAGNOSIS — I10 Essential (primary) hypertension: Secondary | ICD-10-CM | POA: Diagnosis not present

## 2015-06-06 DIAGNOSIS — E039 Hypothyroidism, unspecified: Secondary | ICD-10-CM | POA: Diagnosis not present

## 2015-06-06 DIAGNOSIS — Z125 Encounter for screening for malignant neoplasm of prostate: Secondary | ICD-10-CM | POA: Diagnosis not present

## 2015-06-09 DIAGNOSIS — Z6825 Body mass index (BMI) 25.0-25.9, adult: Secondary | ICD-10-CM | POA: Diagnosis not present

## 2015-06-09 DIAGNOSIS — F3342 Major depressive disorder, recurrent, in full remission: Secondary | ICD-10-CM | POA: Diagnosis not present

## 2015-06-09 DIAGNOSIS — C73 Malignant neoplasm of thyroid gland: Secondary | ICD-10-CM | POA: Diagnosis not present

## 2015-06-09 DIAGNOSIS — N183 Chronic kidney disease, stage 3 (moderate): Secondary | ICD-10-CM | POA: Diagnosis not present

## 2015-06-10 DIAGNOSIS — Z87891 Personal history of nicotine dependence: Secondary | ICD-10-CM | POA: Diagnosis not present

## 2015-06-10 DIAGNOSIS — E890 Postprocedural hypothyroidism: Secondary | ICD-10-CM | POA: Diagnosis not present

## 2015-06-10 DIAGNOSIS — R2681 Unsteadiness on feet: Secondary | ICD-10-CM | POA: Diagnosis not present

## 2015-06-10 DIAGNOSIS — Z85850 Personal history of malignant neoplasm of thyroid: Secondary | ICD-10-CM | POA: Diagnosis not present

## 2015-06-10 DIAGNOSIS — I10 Essential (primary) hypertension: Secondary | ICD-10-CM | POA: Diagnosis not present

## 2015-06-10 DIAGNOSIS — R296 Repeated falls: Secondary | ICD-10-CM | POA: Diagnosis not present

## 2015-06-12 DIAGNOSIS — E890 Postprocedural hypothyroidism: Secondary | ICD-10-CM | POA: Diagnosis not present

## 2015-06-12 DIAGNOSIS — R296 Repeated falls: Secondary | ICD-10-CM | POA: Diagnosis not present

## 2015-06-12 DIAGNOSIS — Z87891 Personal history of nicotine dependence: Secondary | ICD-10-CM | POA: Diagnosis not present

## 2015-06-12 DIAGNOSIS — I10 Essential (primary) hypertension: Secondary | ICD-10-CM | POA: Diagnosis not present

## 2015-06-12 DIAGNOSIS — Z85850 Personal history of malignant neoplasm of thyroid: Secondary | ICD-10-CM | POA: Diagnosis not present

## 2015-06-12 DIAGNOSIS — R2681 Unsteadiness on feet: Secondary | ICD-10-CM | POA: Diagnosis not present

## 2015-06-30 ENCOUNTER — Encounter: Payer: Self-pay | Admitting: Diagnostic Neuroimaging

## 2015-06-30 ENCOUNTER — Ambulatory Visit (INDEPENDENT_AMBULATORY_CARE_PROVIDER_SITE_OTHER): Payer: Medicare Other | Admitting: Diagnostic Neuroimaging

## 2015-06-30 VITALS — BP 129/64 | HR 62 | Ht 65.0 in | Wt 150.8 lb

## 2015-06-30 DIAGNOSIS — G20 Parkinson's disease: Secondary | ICD-10-CM | POA: Diagnosis not present
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
## 2015-07-31 ENCOUNTER — Ambulatory Visit (INDEPENDENT_AMBULATORY_CARE_PROVIDER_SITE_OTHER): Payer: Medicare Other | Admitting: Otolaryngology

## 2015-07-31 DIAGNOSIS — R490 Dysphonia: Secondary | ICD-10-CM

## 2015-07-31 DIAGNOSIS — H903 Sensorineural hearing loss, bilateral: Secondary | ICD-10-CM

## 2015-08-07 ENCOUNTER — Ambulatory Visit (INDEPENDENT_AMBULATORY_CARE_PROVIDER_SITE_OTHER): Payer: Medicare Other | Admitting: Otolaryngology

## 2015-08-12 DIAGNOSIS — Z23 Encounter for immunization: Secondary | ICD-10-CM | POA: Diagnosis not present

## 2015-09-18 DIAGNOSIS — Z87891 Personal history of nicotine dependence: Secondary | ICD-10-CM | POA: Diagnosis not present

## 2015-09-18 DIAGNOSIS — J3801 Paralysis of vocal cords and larynx, unilateral: Secondary | ICD-10-CM | POA: Diagnosis not present

## 2015-09-18 DIAGNOSIS — R1313 Dysphagia, pharyngeal phase: Secondary | ICD-10-CM | POA: Diagnosis not present

## 2015-09-18 DIAGNOSIS — G20 Parkinson's disease: Secondary | ICD-10-CM | POA: Diagnosis not present

## 2015-09-18 DIAGNOSIS — Z7982 Long term (current) use of aspirin: Secondary | ICD-10-CM | POA: Diagnosis not present

## 2015-09-18 DIAGNOSIS — R490 Dysphonia: Secondary | ICD-10-CM | POA: Diagnosis not present

## 2015-10-01 ENCOUNTER — Ambulatory Visit: Payer: Medicare Other | Admitting: Diagnostic Neuroimaging

## 2015-10-10 DIAGNOSIS — Z6826 Body mass index (BMI) 26.0-26.9, adult: Secondary | ICD-10-CM | POA: Diagnosis not present

## 2015-10-10 DIAGNOSIS — G20 Parkinson's disease: Secondary | ICD-10-CM | POA: Diagnosis not present

## 2015-10-10 DIAGNOSIS — I10 Essential (primary) hypertension: Secondary | ICD-10-CM | POA: Diagnosis not present

## 2015-11-05 ENCOUNTER — Ambulatory Visit: Payer: Medicare Other | Admitting: Diagnostic Neuroimaging

## 2015-11-19 ENCOUNTER — Encounter: Payer: Self-pay | Admitting: Diagnostic Neuroimaging

## 2015-11-19 ENCOUNTER — Ambulatory Visit (INDEPENDENT_AMBULATORY_CARE_PROVIDER_SITE_OTHER): Payer: Medicare Other | Admitting: Diagnostic Neuroimaging

## 2015-11-19 VITALS — BP 124/60 | HR 70 | Wt 152.6 lb

## 2015-11-19 DIAGNOSIS — G20A1 Parkinson's disease without dyskinesia, without mention of fluctuations: Secondary | ICD-10-CM

## 2015-11-19 DIAGNOSIS — G20 Parkinson's disease: Secondary | ICD-10-CM

## 2015-11-19 MED ORDER — CARBIDOPA-LEVODOPA 25-100 MG PO TABS: 1.0000 | ORAL_TABLET | Freq: Three times a day (TID) | ORAL | Status: DC

## 2015-11-19 NOTE — Patient Instructions (Signed)
Thank you for coming to see [place] at [facility]. I hope we have been able to provide you high quality care today.  You may receive a patient satisfaction survey over the next few weeks. We would appreciate your feedback and comments so that we may continue to improve ourselves and the health of our patients.  - continue carbidopa/levodopa - caution with walking   ~~~~~~~~~~~~~~~~~~~~~~~~~~~~~~~~~~~~~~~~~~~~~~~~~~~~~~~~~~~~~~~~~  DR. Shon Mansouri'S GUIDE TO HAPPY AND HEALTHY LIVING These are some of my general health and wellness recommendations. Some of them may apply to you better than others. Please use common sense as you try these suggestions and feel free to ask me any questions.   ACTIVITY/FITNESS Mental, social, emotional and physical stimulation are very important for brain and body health. Try learning a new activity (arts, music, language, sports, games).  Keep moving your body to the best of your abilities. You can do this at home, inside or outside, the park, community center, gym or anywhere you like. Consider a physical therapist or personal trainer to get started. Consider the app Sworkit. Fitness trackers such as smart-watches, smart-phones or Fitbits can help as well.   NUTRITION Eat more plants: colorful vegetables, nuts, seeds and berries.  Eat less sugar, salt, preservatives and processed foods.  Avoid toxins such as cigarettes and alcohol.  Drink water when you are thirsty. Warm water with a slice of lemon is an excellent morning drink to start the day.  Consider these websites for more information The Nutrition [employer] (https://www.henry-hernandez.biz/) [employer] (WindowBlog.ch)   RELAXATION Consider practicing mindfulness meditation or other relaxation techniques such as deep breathing, prayer, yoga, tai chi, massage. See website mindful.org or the apps Headspace or Calm to help get  started.   SLEEP Try to get at least 7-8+ hours sleep per day. Regular exercise and reduced caffeine will help you sleep better. Practice good sleep hygeine techniques. See website sleep.org for more information.   PLANNING Prepare estate planning, living will, healthcare POA documents. Sometimes this is best planned with the help of an attorney. Theconversationproject.org and agingwithdignity.org are excellent resources.
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
## 2016-02-27 DIAGNOSIS — G20 Parkinson's disease: Secondary | ICD-10-CM | POA: Diagnosis not present

## 2016-02-27 DIAGNOSIS — Z6826 Body mass index (BMI) 26.0-26.9, adult: Secondary | ICD-10-CM | POA: Diagnosis not present

## 2016-02-27 DIAGNOSIS — I10 Essential (primary) hypertension: Secondary | ICD-10-CM | POA: Diagnosis not present

## 2016-03-11 DIAGNOSIS — Z85850 Personal history of malignant neoplasm of thyroid: Secondary | ICD-10-CM | POA: Diagnosis not present

## 2016-03-11 DIAGNOSIS — F329 Major depressive disorder, single episode, unspecified: Secondary | ICD-10-CM | POA: Diagnosis not present

## 2016-03-11 DIAGNOSIS — G20 Parkinson's disease: Secondary | ICD-10-CM | POA: Diagnosis not present

## 2016-03-11 DIAGNOSIS — I10 Essential (primary) hypertension: Secondary | ICD-10-CM | POA: Diagnosis not present

## 2016-03-11 DIAGNOSIS — K449 Diaphragmatic hernia without obstruction or gangrene: Secondary | ICD-10-CM | POA: Diagnosis not present

## 2016-03-11 DIAGNOSIS — E890 Postprocedural hypothyroidism: Secondary | ICD-10-CM | POA: Diagnosis not present

## 2016-03-11 DIAGNOSIS — R1313 Dysphagia, pharyngeal phase: Secondary | ICD-10-CM | POA: Diagnosis not present

## 2016-03-15 DIAGNOSIS — K449 Diaphragmatic hernia without obstruction or gangrene: Secondary | ICD-10-CM | POA: Diagnosis not present

## 2016-03-15 DIAGNOSIS — R1313 Dysphagia, pharyngeal phase: Secondary | ICD-10-CM | POA: Diagnosis not present

## 2016-03-15 DIAGNOSIS — E890 Postprocedural hypothyroidism: Secondary | ICD-10-CM | POA: Diagnosis not present

## 2016-03-15 DIAGNOSIS — I10 Essential (primary) hypertension: Secondary | ICD-10-CM | POA: Diagnosis not present

## 2016-03-15 DIAGNOSIS — Z85850 Personal history of malignant neoplasm of thyroid: Secondary | ICD-10-CM | POA: Diagnosis not present

## 2016-03-15 DIAGNOSIS — G20 Parkinson's disease: Secondary | ICD-10-CM | POA: Diagnosis not present

## 2016-03-19 DIAGNOSIS — E890 Postprocedural hypothyroidism: Secondary | ICD-10-CM | POA: Diagnosis not present

## 2016-03-19 DIAGNOSIS — Z85850 Personal history of malignant neoplasm of thyroid: Secondary | ICD-10-CM | POA: Diagnosis not present

## 2016-03-19 DIAGNOSIS — K449 Diaphragmatic hernia without obstruction or gangrene: Secondary | ICD-10-CM | POA: Diagnosis not present

## 2016-03-19 DIAGNOSIS — R1313 Dysphagia, pharyngeal phase: Secondary | ICD-10-CM | POA: Diagnosis not present

## 2016-03-19 DIAGNOSIS — G20 Parkinson's disease: Secondary | ICD-10-CM | POA: Diagnosis not present

## 2016-03-19 DIAGNOSIS — I10 Essential (primary) hypertension: Secondary | ICD-10-CM | POA: Diagnosis not present

## 2016-03-23 DIAGNOSIS — E890 Postprocedural hypothyroidism: Secondary | ICD-10-CM | POA: Diagnosis not present

## 2016-03-23 DIAGNOSIS — K449 Diaphragmatic hernia without obstruction or gangrene: Secondary | ICD-10-CM | POA: Diagnosis not present

## 2016-03-23 DIAGNOSIS — R1313 Dysphagia, pharyngeal phase: Secondary | ICD-10-CM | POA: Diagnosis not present

## 2016-03-23 DIAGNOSIS — I10 Essential (primary) hypertension: Secondary | ICD-10-CM | POA: Diagnosis not present

## 2016-03-23 DIAGNOSIS — G20 Parkinson's disease: Secondary | ICD-10-CM | POA: Diagnosis not present

## 2016-03-23 DIAGNOSIS — Z85850 Personal history of malignant neoplasm of thyroid: Secondary | ICD-10-CM | POA: Diagnosis not present

## 2016-03-30 DIAGNOSIS — R1313 Dysphagia, pharyngeal phase: Secondary | ICD-10-CM | POA: Diagnosis not present

## 2016-03-30 DIAGNOSIS — G20 Parkinson's disease: Secondary | ICD-10-CM | POA: Diagnosis not present

## 2016-03-30 DIAGNOSIS — Z85850 Personal history of malignant neoplasm of thyroid: Secondary | ICD-10-CM | POA: Diagnosis not present

## 2016-03-30 DIAGNOSIS — E890 Postprocedural hypothyroidism: Secondary | ICD-10-CM | POA: Diagnosis not present

## 2016-03-30 DIAGNOSIS — K449 Diaphragmatic hernia without obstruction or gangrene: Secondary | ICD-10-CM | POA: Diagnosis not present

## 2016-03-30 DIAGNOSIS — I10 Essential (primary) hypertension: Secondary | ICD-10-CM | POA: Diagnosis not present

## 2016-04-05 DIAGNOSIS — Z85850 Personal history of malignant neoplasm of thyroid: Secondary | ICD-10-CM | POA: Diagnosis not present

## 2016-04-05 DIAGNOSIS — E890 Postprocedural hypothyroidism: Secondary | ICD-10-CM | POA: Diagnosis not present

## 2016-04-05 DIAGNOSIS — G20 Parkinson's disease: Secondary | ICD-10-CM | POA: Diagnosis not present

## 2016-04-05 DIAGNOSIS — I10 Essential (primary) hypertension: Secondary | ICD-10-CM | POA: Diagnosis not present

## 2016-04-05 DIAGNOSIS — R1313 Dysphagia, pharyngeal phase: Secondary | ICD-10-CM | POA: Diagnosis not present

## 2016-04-05 DIAGNOSIS — K449 Diaphragmatic hernia without obstruction or gangrene: Secondary | ICD-10-CM | POA: Diagnosis not present

## 2016-04-14 DIAGNOSIS — R1313 Dysphagia, pharyngeal phase: Secondary | ICD-10-CM | POA: Diagnosis not present

## 2016-04-14 DIAGNOSIS — E890 Postprocedural hypothyroidism: Secondary | ICD-10-CM | POA: Diagnosis not present

## 2016-04-14 DIAGNOSIS — I10 Essential (primary) hypertension: Secondary | ICD-10-CM | POA: Diagnosis not present

## 2016-04-14 DIAGNOSIS — K449 Diaphragmatic hernia without obstruction or gangrene: Secondary | ICD-10-CM | POA: Diagnosis not present

## 2016-04-14 DIAGNOSIS — G20 Parkinson's disease: Secondary | ICD-10-CM | POA: Diagnosis not present

## 2016-04-14 DIAGNOSIS — Z85850 Personal history of malignant neoplasm of thyroid: Secondary | ICD-10-CM | POA: Diagnosis not present

## 2016-04-20 DIAGNOSIS — G20 Parkinson's disease: Secondary | ICD-10-CM | POA: Diagnosis not present

## 2016-04-20 DIAGNOSIS — E890 Postprocedural hypothyroidism: Secondary | ICD-10-CM | POA: Diagnosis not present

## 2016-04-20 DIAGNOSIS — I10 Essential (primary) hypertension: Secondary | ICD-10-CM | POA: Diagnosis not present

## 2016-04-20 DIAGNOSIS — Z85850 Personal history of malignant neoplasm of thyroid: Secondary | ICD-10-CM | POA: Diagnosis not present

## 2016-04-20 DIAGNOSIS — K449 Diaphragmatic hernia without obstruction or gangrene: Secondary | ICD-10-CM | POA: Diagnosis not present

## 2016-04-20 DIAGNOSIS — R1313 Dysphagia, pharyngeal phase: Secondary | ICD-10-CM | POA: Diagnosis not present

## 2016-04-27 DIAGNOSIS — G20 Parkinson's disease: Secondary | ICD-10-CM | POA: Diagnosis not present

## 2016-04-27 DIAGNOSIS — Z85850 Personal history of malignant neoplasm of thyroid: Secondary | ICD-10-CM | POA: Diagnosis not present

## 2016-04-27 DIAGNOSIS — E890 Postprocedural hypothyroidism: Secondary | ICD-10-CM | POA: Diagnosis not present

## 2016-04-27 DIAGNOSIS — R1313 Dysphagia, pharyngeal phase: Secondary | ICD-10-CM | POA: Diagnosis not present

## 2016-04-27 DIAGNOSIS — K449 Diaphragmatic hernia without obstruction or gangrene: Secondary | ICD-10-CM | POA: Diagnosis not present

## 2016-04-27 DIAGNOSIS — I10 Essential (primary) hypertension: Secondary | ICD-10-CM | POA: Diagnosis not present

## 2016-04-29 DIAGNOSIS — M25551 Pain in right hip: Secondary | ICD-10-CM | POA: Diagnosis not present

## 2016-04-29 DIAGNOSIS — M545 Low back pain: Secondary | ICD-10-CM | POA: Diagnosis not present

## 2016-05-05 DIAGNOSIS — Z85850 Personal history of malignant neoplasm of thyroid: Secondary | ICD-10-CM | POA: Diagnosis not present

## 2016-05-05 DIAGNOSIS — I10 Essential (primary) hypertension: Secondary | ICD-10-CM | POA: Diagnosis not present

## 2016-05-05 DIAGNOSIS — R1313 Dysphagia, pharyngeal phase: Secondary | ICD-10-CM | POA: Diagnosis not present

## 2016-05-05 DIAGNOSIS — G20 Parkinson's disease: Secondary | ICD-10-CM | POA: Diagnosis not present

## 2016-05-05 DIAGNOSIS — E890 Postprocedural hypothyroidism: Secondary | ICD-10-CM | POA: Diagnosis not present

## 2016-05-05 DIAGNOSIS — K449 Diaphragmatic hernia without obstruction or gangrene: Secondary | ICD-10-CM | POA: Diagnosis not present

## 2016-05-21 ENCOUNTER — Other Ambulatory Visit: Payer: Self-pay | Admitting: Orthopaedic Surgery

## 2016-05-21 DIAGNOSIS — G8929 Other chronic pain: Secondary | ICD-10-CM

## 2016-05-21 DIAGNOSIS — M545 Low back pain: Principal | ICD-10-CM

## 2016-08-19 DIAGNOSIS — Z23 Encounter for immunization: Secondary | ICD-10-CM | POA: Diagnosis not present

## 2016-09-17 DIAGNOSIS — H52223 Regular astigmatism, bilateral: Secondary | ICD-10-CM | POA: Diagnosis not present

## 2016-09-17 DIAGNOSIS — H3530 Unspecified macular degeneration: Secondary | ICD-10-CM | POA: Diagnosis not present

## 2016-09-17 DIAGNOSIS — H5203 Hypermetropia, bilateral: Secondary | ICD-10-CM | POA: Diagnosis not present

## 2016-09-17 DIAGNOSIS — H2513 Age-related nuclear cataract, bilateral: Secondary | ICD-10-CM | POA: Diagnosis not present

## 2016-11-17 ENCOUNTER — Ambulatory Visit: Payer: Medicare Other | Admitting: Diagnostic Neuroimaging

## 2016-11-18 ENCOUNTER — Ambulatory Visit: Payer: Medicare Other | Admitting: Diagnostic Neuroimaging

## 2016-11-19 ENCOUNTER — Telehealth: Payer: Self-pay | Admitting: *Deleted

## 2016-11-19 NOTE — Telephone Encounter (Signed)
Spoke with daughter ,Isaias Sakai who transports patient. Rescheduled FU due to office closed for snow. She verbalized understanding, appreciation.

## 2016-11-30 ENCOUNTER — Ambulatory Visit (INDEPENDENT_AMBULATORY_CARE_PROVIDER_SITE_OTHER): Payer: Medicare Other | Admitting: Diagnostic Neuroimaging

## 2016-11-30 ENCOUNTER — Encounter: Payer: Self-pay | Admitting: Diagnostic Neuroimaging

## 2016-11-30 VITALS — BP 128/64 | HR 63 | Wt 156.2 lb

## 2016-11-30 DIAGNOSIS — G20 Parkinson's disease: Secondary | ICD-10-CM | POA: Diagnosis not present

## 2016-11-30 DIAGNOSIS — M545 Low back pain: Secondary | ICD-10-CM | POA: Diagnosis not present

## 2016-11-30 DIAGNOSIS — R269 Unspecified abnormalities of gait and mobility: Secondary | ICD-10-CM | POA: Diagnosis not present

## 2016-11-30 DIAGNOSIS — G8929 Other chronic pain: Secondary | ICD-10-CM | POA: Diagnosis not present

## 2016-11-30 MED ORDER — HYDROCODONE-ACETAMINOPHEN 5-325 MG PO TABS: 1.0000 | ORAL_TABLET | Freq: Every day | ORAL | 0 refills | Status: AC | PRN

## 2016-11-30 MED ORDER — CARBIDOPA-LEVODOPA 25-100 MG PO TABS: 1.0000 | ORAL_TABLET | Freq: Three times a day (TID) | ORAL | 4 refills | Status: DC
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
## 2016-12-02 ENCOUNTER — Telehealth (INDEPENDENT_AMBULATORY_CARE_PROVIDER_SITE_OTHER): Payer: Self-pay | Admitting: Orthopaedic Surgery

## 2016-12-02 DIAGNOSIS — M545 Low back pain: Principal | ICD-10-CM

## 2016-12-02 DIAGNOSIS — G8929 Other chronic pain: Secondary | ICD-10-CM

## 2016-12-02 NOTE — Telephone Encounter (Signed)
Please advise 

## 2016-12-02 NOTE — Addendum Note (Signed)
Addended by: Meyer Cory on: 12/02/2016 05:03 PM   Modules accepted: Orders

## 2016-12-02 NOTE — Telephone Encounter (Signed)
Order entered into system.

## 2016-12-02 NOTE — Telephone Encounter (Signed)
OK to refer to [facility] . Has Parkinson,  Lateral lithesis some curvature, might be facet vs some stenosis

## 2016-12-02 NOTE — Telephone Encounter (Signed)
Preston Martinez patient's daughter states that the Prednisone did not help her dad and that his Neurologist wanted to know if Dr Ernestina Patches could give the patient a steroid injection.

## 2016-12-06 DIAGNOSIS — M545 Low back pain: Secondary | ICD-10-CM | POA: Diagnosis not present

## 2016-12-06 DIAGNOSIS — K219 Gastro-esophageal reflux disease without esophagitis: Secondary | ICD-10-CM | POA: Diagnosis not present

## 2016-12-06 DIAGNOSIS — F419 Anxiety disorder, unspecified: Secondary | ICD-10-CM | POA: Diagnosis not present

## 2016-12-06 DIAGNOSIS — Z9181 History of falling: Secondary | ICD-10-CM | POA: Diagnosis not present

## 2016-12-06 DIAGNOSIS — Z87891 Personal history of nicotine dependence: Secondary | ICD-10-CM | POA: Diagnosis not present

## 2016-12-06 DIAGNOSIS — Z7982 Long term (current) use of aspirin: Secondary | ICD-10-CM | POA: Diagnosis not present

## 2016-12-06 DIAGNOSIS — G20 Parkinson's disease: Secondary | ICD-10-CM | POA: Diagnosis not present

## 2016-12-06 DIAGNOSIS — G8929 Other chronic pain: Secondary | ICD-10-CM | POA: Diagnosis not present

## 2016-12-06 DIAGNOSIS — I10 Essential (primary) hypertension: Secondary | ICD-10-CM | POA: Diagnosis not present

## 2016-12-06 DIAGNOSIS — Z85850 Personal history of malignant neoplasm of thyroid: Secondary | ICD-10-CM | POA: Diagnosis not present

## 2016-12-06 DIAGNOSIS — F329 Major depressive disorder, single episode, unspecified: Secondary | ICD-10-CM | POA: Diagnosis not present

## 2016-12-08 ENCOUNTER — Ambulatory Visit (INDEPENDENT_AMBULATORY_CARE_PROVIDER_SITE_OTHER): Payer: Self-pay

## 2016-12-08 ENCOUNTER — Encounter (INDEPENDENT_AMBULATORY_CARE_PROVIDER_SITE_OTHER): Payer: Self-pay | Admitting: Physical Medicine and Rehabilitation

## 2016-12-08 ENCOUNTER — Ambulatory Visit (INDEPENDENT_AMBULATORY_CARE_PROVIDER_SITE_OTHER): Payer: Medicare Other | Admitting: Physical Medicine and Rehabilitation

## 2016-12-08 VITALS — BP 139/63 | HR 72

## 2016-12-08 DIAGNOSIS — G20 Parkinson's disease: Secondary | ICD-10-CM

## 2016-12-08 DIAGNOSIS — M5416 Radiculopathy, lumbar region: Secondary | ICD-10-CM | POA: Diagnosis not present

## 2016-12-08 DIAGNOSIS — M47816 Spondylosis without myelopathy or radiculopathy, lumbar region: Secondary | ICD-10-CM | POA: Diagnosis not present

## 2016-12-08 DIAGNOSIS — M5136 Other intervertebral disc degeneration, lumbar region: Secondary | ICD-10-CM | POA: Insufficient documentation

## 2016-12-08 DIAGNOSIS — M51369 Other intervertebral disc degeneration, lumbar region without mention of lumbar back pain or lower extremity pain: Secondary | ICD-10-CM | POA: Insufficient documentation

## 2016-12-08 MED ORDER — BUPIVACAINE HCL 0.5 % IJ SOLN
50.0000 mL | Freq: Once | INTRAMUSCULAR | Status: AC
  Administered 2016-12-08: 50 mL

## 2016-12-08 MED ORDER — METHYLPREDNISOLONE ACETATE 80 MG/ML IJ SUSP
80.0000 mg | Freq: Once | INTRAMUSCULAR | Status: AC
  Administered 2016-12-08: 80 mg

## 2016-12-08 MED ORDER — LIDOCAINE HCL (PF) 1 % IJ SOLN: 0.3300 mL | Freq: Once | INTRAMUSCULAR | Status: AC

## 2016-12-08 NOTE — Procedures (Signed)
Lumbar Facet Joint Intra-Articular Injection(s) with Fluoroscopic Guidance  Patient: Preston Martinez      Date of Birth: 1924/12/13 MRN: [id number] PCP: Asencion Noble, MD      Visit Date: 12/08/2016   Universal Protocol:    Date/Time: 02/07/184:54 PM  Consent Given By: the patient  Position: PRONE   Additional Comments: Vital signs were monitored before and after the procedure. Patient was prepped and draped in the usual sterile fashion. The correct patient, procedure, and site was verified.   Injection Procedure Details:  Procedure Site One Meds Administered:  Meds ordered this encounter  Medications  . lidocaine (PF) (XYLOCAINE) 1 % injection 0.3 mL  . bupivacaine (MARCAINE) 0.5 % (with pres) injection 50 mL  . methylPREDNISolone acetate (DEPO-MEDROL) injection 80 mg     Laterality: Left  Location/Site:  L4-L5 L5-S1  Needle size: 22 guage  Needle type: Spinal  Needle Placement: Articular  Findings:  -Contrast Used: 1 mL iohexol 180 mg iodine/mL   -Comments: Excellent flow of contrast producing a partial arthrogram.  Procedure Details: The fluoroscope beam is vertically oriented in AP, and the inferior recess is visualized beneath the lower pole of the inferior apophyseal process, which represents the target point for needle insertion. When direct visualization is difficult the target point is located at the medial projection of the vertebral pedicle. The region overlying each aforementioned target is locally anesthetized with a 1 to 2 ml. volume of 1% Lidocaine without Epinephrine.   The spinal needle was inserted into each of the above mentioned facet joints using biplanar fluoroscopic guidance. A 0.25 to 0.5 ml. volume of Isovue-250 was injected and a partial facet joint arthrogram was obtained. A single spot film was obtained of the resulting arthrogram.    One to 1.25 ml of the steroid/anesthetic solution was then injected into each of the facet joints noted  above.   Additional Comments:  The patient tolerated the procedure well No complications occurred Dressing: Band-Aid    Post-procedure details: Patient was observed during the procedure. Post-procedure instructions were reviewed.  Patient left the clinic in stable condition.
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
## 2016-12-08 NOTE — Patient Instructions (Signed)

## 2016-12-10 DIAGNOSIS — I10 Essential (primary) hypertension: Secondary | ICD-10-CM | POA: Diagnosis not present

## 2016-12-10 DIAGNOSIS — M545 Low back pain: Secondary | ICD-10-CM | POA: Diagnosis not present

## 2016-12-10 DIAGNOSIS — G8929 Other chronic pain: Secondary | ICD-10-CM | POA: Diagnosis not present

## 2016-12-10 DIAGNOSIS — Z9181 History of falling: Secondary | ICD-10-CM | POA: Diagnosis not present

## 2016-12-10 DIAGNOSIS — G20 Parkinson's disease: Secondary | ICD-10-CM | POA: Diagnosis not present

## 2016-12-10 DIAGNOSIS — F329 Major depressive disorder, single episode, unspecified: Secondary | ICD-10-CM | POA: Diagnosis not present

## 2016-12-14 DIAGNOSIS — F329 Major depressive disorder, single episode, unspecified: Secondary | ICD-10-CM | POA: Diagnosis not present

## 2016-12-14 DIAGNOSIS — G8929 Other chronic pain: Secondary | ICD-10-CM | POA: Diagnosis not present

## 2016-12-14 DIAGNOSIS — I10 Essential (primary) hypertension: Secondary | ICD-10-CM | POA: Diagnosis not present

## 2016-12-14 DIAGNOSIS — M545 Low back pain: Secondary | ICD-10-CM | POA: Diagnosis not present

## 2016-12-14 DIAGNOSIS — G20 Parkinson's disease: Secondary | ICD-10-CM | POA: Diagnosis not present

## 2016-12-14 DIAGNOSIS — Z9181 History of falling: Secondary | ICD-10-CM | POA: Diagnosis not present

## 2016-12-17 DIAGNOSIS — G20 Parkinson's disease: Secondary | ICD-10-CM | POA: Diagnosis not present

## 2016-12-17 DIAGNOSIS — F329 Major depressive disorder, single episode, unspecified: Secondary | ICD-10-CM | POA: Diagnosis not present

## 2016-12-17 DIAGNOSIS — G8929 Other chronic pain: Secondary | ICD-10-CM | POA: Diagnosis not present

## 2016-12-17 DIAGNOSIS — I10 Essential (primary) hypertension: Secondary | ICD-10-CM | POA: Diagnosis not present

## 2016-12-17 DIAGNOSIS — M545 Low back pain: Secondary | ICD-10-CM | POA: Diagnosis not present

## 2016-12-17 DIAGNOSIS — Z9181 History of falling: Secondary | ICD-10-CM | POA: Diagnosis not present

## 2016-12-20 DIAGNOSIS — M545 Low back pain: Secondary | ICD-10-CM | POA: Diagnosis not present

## 2016-12-20 DIAGNOSIS — G8929 Other chronic pain: Secondary | ICD-10-CM | POA: Diagnosis not present

## 2016-12-20 DIAGNOSIS — I10 Essential (primary) hypertension: Secondary | ICD-10-CM | POA: Diagnosis not present

## 2016-12-20 DIAGNOSIS — G20 Parkinson's disease: Secondary | ICD-10-CM | POA: Diagnosis not present

## 2016-12-20 DIAGNOSIS — F329 Major depressive disorder, single episode, unspecified: Secondary | ICD-10-CM | POA: Diagnosis not present

## 2016-12-20 DIAGNOSIS — Z9181 History of falling: Secondary | ICD-10-CM | POA: Diagnosis not present

## 2016-12-23 ENCOUNTER — Ambulatory Visit (INDEPENDENT_AMBULATORY_CARE_PROVIDER_SITE_OTHER): Payer: Medicare Other | Admitting: Orthopaedic Surgery

## 2016-12-23 DIAGNOSIS — G8929 Other chronic pain: Secondary | ICD-10-CM | POA: Diagnosis not present

## 2016-12-23 DIAGNOSIS — I10 Essential (primary) hypertension: Secondary | ICD-10-CM | POA: Diagnosis not present

## 2016-12-23 DIAGNOSIS — M545 Low back pain: Secondary | ICD-10-CM | POA: Diagnosis not present

## 2016-12-23 DIAGNOSIS — G20 Parkinson's disease: Secondary | ICD-10-CM | POA: Diagnosis not present

## 2016-12-23 DIAGNOSIS — F329 Major depressive disorder, single episode, unspecified: Secondary | ICD-10-CM | POA: Diagnosis not present

## 2016-12-23 DIAGNOSIS — Z9181 History of falling: Secondary | ICD-10-CM | POA: Diagnosis not present

## 2016-12-27 DIAGNOSIS — Z9181 History of falling: Secondary | ICD-10-CM | POA: Diagnosis not present

## 2016-12-27 DIAGNOSIS — G8929 Other chronic pain: Secondary | ICD-10-CM | POA: Diagnosis not present

## 2016-12-27 DIAGNOSIS — I10 Essential (primary) hypertension: Secondary | ICD-10-CM | POA: Diagnosis not present

## 2016-12-27 DIAGNOSIS — G20 Parkinson's disease: Secondary | ICD-10-CM | POA: Diagnosis not present

## 2016-12-27 DIAGNOSIS — M545 Low back pain: Secondary | ICD-10-CM | POA: Diagnosis not present

## 2016-12-27 DIAGNOSIS — F329 Major depressive disorder, single episode, unspecified: Secondary | ICD-10-CM | POA: Diagnosis not present

## 2016-12-30 DIAGNOSIS — G8929 Other chronic pain: Secondary | ICD-10-CM | POA: Diagnosis not present

## 2016-12-30 DIAGNOSIS — G20 Parkinson's disease: Secondary | ICD-10-CM | POA: Diagnosis not present

## 2016-12-30 DIAGNOSIS — F329 Major depressive disorder, single episode, unspecified: Secondary | ICD-10-CM | POA: Diagnosis not present

## 2016-12-30 DIAGNOSIS — I10 Essential (primary) hypertension: Secondary | ICD-10-CM | POA: Diagnosis not present

## 2016-12-30 DIAGNOSIS — M545 Low back pain: Secondary | ICD-10-CM | POA: Diagnosis not present

## 2016-12-30 DIAGNOSIS — Z9181 History of falling: Secondary | ICD-10-CM | POA: Diagnosis not present

## 2017-02-01 DIAGNOSIS — T364X5A Adverse effect of tetracyclines, initial encounter: Secondary | ICD-10-CM | POA: Diagnosis not present

## 2017-04-05 DIAGNOSIS — N183 Chronic kidney disease, stage 3 (moderate): Secondary | ICD-10-CM | POA: Diagnosis not present

## 2017-04-05 DIAGNOSIS — M4306 Spondylolysis, lumbar region: Secondary | ICD-10-CM | POA: Diagnosis not present

## 2017-04-05 DIAGNOSIS — E039 Hypothyroidism, unspecified: Secondary | ICD-10-CM | POA: Diagnosis not present

## 2017-04-05 DIAGNOSIS — Z6826 Body mass index (BMI) 26.0-26.9, adult: Secondary | ICD-10-CM | POA: Diagnosis not present

## 2017-04-29 DIAGNOSIS — N183 Chronic kidney disease, stage 3 (moderate): Secondary | ICD-10-CM | POA: Diagnosis not present

## 2017-04-29 DIAGNOSIS — E119 Type 2 diabetes mellitus without complications: Secondary | ICD-10-CM | POA: Diagnosis not present

## 2017-04-29 DIAGNOSIS — E039 Hypothyroidism, unspecified: Secondary | ICD-10-CM | POA: Diagnosis not present

## 2017-04-29 DIAGNOSIS — Z79899 Other long term (current) drug therapy: Secondary | ICD-10-CM | POA: Diagnosis not present

## 2017-08-27 DIAGNOSIS — Z23 Encounter for immunization: Secondary | ICD-10-CM | POA: Diagnosis not present

## 2017-12-07 ENCOUNTER — Ambulatory Visit: Payer: Medicare Other | Admitting: Diagnostic Neuroimaging

## 2018-01-02 ENCOUNTER — Ambulatory Visit: Payer: Medicare Other | Admitting: Diagnostic Neuroimaging

## 2018-01-03 ENCOUNTER — Encounter: Payer: Self-pay | Admitting: Diagnostic Neuroimaging

## 2018-01-11 ENCOUNTER — Telehealth: Payer: Self-pay | Admitting: Diagnostic Neuroimaging

## 2018-01-11 MED ORDER — CARBIDOPA-LEVODOPA 25-100 MG PO TABS: 1.0000 | ORAL_TABLET | Freq: Three times a day (TID) | ORAL | 4 refills | Status: DC

## 2018-01-11 NOTE — Addendum Note (Signed)
Addended by: Brandon Melnick on: 01/11/2018 10:26 AM   Modules accepted: Orders

## 2018-01-11 NOTE — Telephone Encounter (Signed)
Patient's daughter requesting refill of carbidopa-levodopa (SINEMET IR) 25-100 MG tablet called to [facility] in [place]. Patient has appointment with Dr. Leta Baptist on 06-28-18.

## 2018-03-20 DIAGNOSIS — E039 Hypothyroidism, unspecified: Secondary | ICD-10-CM | POA: Diagnosis not present

## 2018-03-20 DIAGNOSIS — Z6824 Body mass index (BMI) 24.0-24.9, adult: Secondary | ICD-10-CM | POA: Diagnosis not present

## 2018-03-20 DIAGNOSIS — R1310 Dysphagia, unspecified: Secondary | ICD-10-CM | POA: Diagnosis not present

## 2018-03-24 DIAGNOSIS — E039 Hypothyroidism, unspecified: Secondary | ICD-10-CM | POA: Diagnosis not present

## 2018-05-09 DIAGNOSIS — I10 Essential (primary) hypertension: Secondary | ICD-10-CM | POA: Diagnosis not present

## 2018-05-09 DIAGNOSIS — F329 Major depressive disorder, single episode, unspecified: Secondary | ICD-10-CM | POA: Diagnosis not present

## 2018-05-09 DIAGNOSIS — K219 Gastro-esophageal reflux disease without esophagitis: Secondary | ICD-10-CM | POA: Diagnosis not present

## 2018-05-09 DIAGNOSIS — K8020 Calculus of gallbladder without cholecystitis without obstruction: Secondary | ICD-10-CM | POA: Diagnosis not present

## 2018-05-09 DIAGNOSIS — Z9089 Acquired absence of other organs: Secondary | ICD-10-CM | POA: Diagnosis not present

## 2018-05-09 DIAGNOSIS — Z85850 Personal history of malignant neoplasm of thyroid: Secondary | ICD-10-CM | POA: Diagnosis not present

## 2018-05-09 DIAGNOSIS — R1312 Dysphagia, oropharyngeal phase: Secondary | ICD-10-CM | POA: Diagnosis not present

## 2018-05-09 DIAGNOSIS — G20 Parkinson's disease: Secondary | ICD-10-CM | POA: Diagnosis not present

## 2018-05-09 DIAGNOSIS — E890 Postprocedural hypothyroidism: Secondary | ICD-10-CM | POA: Diagnosis not present

## 2018-05-15 DIAGNOSIS — R1312 Dysphagia, oropharyngeal phase: Secondary | ICD-10-CM | POA: Diagnosis not present

## 2018-05-15 DIAGNOSIS — F329 Major depressive disorder, single episode, unspecified: Secondary | ICD-10-CM | POA: Diagnosis not present

## 2018-05-15 DIAGNOSIS — K8020 Calculus of gallbladder without cholecystitis without obstruction: Secondary | ICD-10-CM | POA: Diagnosis not present

## 2018-05-15 DIAGNOSIS — I10 Essential (primary) hypertension: Secondary | ICD-10-CM | POA: Diagnosis not present

## 2018-05-15 DIAGNOSIS — G20 Parkinson's disease: Secondary | ICD-10-CM | POA: Diagnosis not present

## 2018-05-15 DIAGNOSIS — K219 Gastro-esophageal reflux disease without esophagitis: Secondary | ICD-10-CM | POA: Diagnosis not present

## 2018-05-18 DIAGNOSIS — K219 Gastro-esophageal reflux disease without esophagitis: Secondary | ICD-10-CM | POA: Diagnosis not present

## 2018-05-18 DIAGNOSIS — K8020 Calculus of gallbladder without cholecystitis without obstruction: Secondary | ICD-10-CM | POA: Diagnosis not present

## 2018-05-18 DIAGNOSIS — I10 Essential (primary) hypertension: Secondary | ICD-10-CM | POA: Diagnosis not present

## 2018-05-18 DIAGNOSIS — G20 Parkinson's disease: Secondary | ICD-10-CM | POA: Diagnosis not present

## 2018-05-18 DIAGNOSIS — R1312 Dysphagia, oropharyngeal phase: Secondary | ICD-10-CM | POA: Diagnosis not present

## 2018-05-18 DIAGNOSIS — F329 Major depressive disorder, single episode, unspecified: Secondary | ICD-10-CM | POA: Diagnosis not present

## 2018-05-23 DIAGNOSIS — I10 Essential (primary) hypertension: Secondary | ICD-10-CM | POA: Diagnosis not present

## 2018-05-23 DIAGNOSIS — K8020 Calculus of gallbladder without cholecystitis without obstruction: Secondary | ICD-10-CM | POA: Diagnosis not present

## 2018-05-23 DIAGNOSIS — G20 Parkinson's disease: Secondary | ICD-10-CM | POA: Diagnosis not present

## 2018-05-23 DIAGNOSIS — F329 Major depressive disorder, single episode, unspecified: Secondary | ICD-10-CM | POA: Diagnosis not present

## 2018-05-23 DIAGNOSIS — R1312 Dysphagia, oropharyngeal phase: Secondary | ICD-10-CM | POA: Diagnosis not present

## 2018-05-23 DIAGNOSIS — K219 Gastro-esophageal reflux disease without esophagitis: Secondary | ICD-10-CM | POA: Diagnosis not present

## 2018-06-14 ENCOUNTER — Telehealth: Payer: Self-pay | Admitting: *Deleted

## 2018-06-14 NOTE — Telephone Encounter (Signed)
LVM for daughter, Isaias Sakai on [place] and informed her that patient's FU on 06/28/18 needs to be moved from 1:30 pm to 3 pm, same day. Advised her this RN is moving appt time. Requested she call back to confirm this is okay with her. Left number.

## 2018-06-28 ENCOUNTER — Ambulatory Visit: Payer: Medicare Other | Admitting: Diagnostic Neuroimaging

## 2018-06-28 ENCOUNTER — Encounter: Payer: Self-pay | Admitting: Diagnostic Neuroimaging

## 2018-06-28 ENCOUNTER — Ambulatory Visit (INDEPENDENT_AMBULATORY_CARE_PROVIDER_SITE_OTHER): Payer: Medicare Other | Admitting: Diagnostic Neuroimaging

## 2018-06-28 ENCOUNTER — Encounter

## 2018-06-28 VITALS — BP 142/74 | HR 72 | Ht 65.0 in | Wt 139.0 lb

## 2018-06-28 DIAGNOSIS — G8929 Other chronic pain: Secondary | ICD-10-CM

## 2018-06-28 DIAGNOSIS — R269 Unspecified abnormalities of gait and mobility: Secondary | ICD-10-CM | POA: Diagnosis not present

## 2018-06-28 DIAGNOSIS — M545 Low back pain: Secondary | ICD-10-CM | POA: Diagnosis not present

## 2018-06-28 DIAGNOSIS — G20 Parkinson's disease: Secondary | ICD-10-CM

## 2018-06-28 DIAGNOSIS — R1310 Dysphagia, unspecified: Secondary | ICD-10-CM

## 2018-06-28 MED ORDER — CARBIDOPA-LEVODOPA 25-100 MG PO TABS: 1.0000 | ORAL_TABLET | Freq: Three times a day (TID) | ORAL | 4 refills | Status: AC
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
## 2018-06-28 NOTE — Patient Instructions (Addendum)
-   continue carb/levo (25/100) 1 tabs three times a day (may try twice a day or three times a day) - consider botox for sialorrhea - use anti-acid (prevacid or pepcid)

## 2018-08-18 DIAGNOSIS — Z23 Encounter for immunization: Secondary | ICD-10-CM | POA: Diagnosis not present

## 2019-02-21 DIAGNOSIS — G20 Parkinson's disease: Secondary | ICD-10-CM | POA: Diagnosis not present

## 2019-02-21 DIAGNOSIS — F0631 Mood disorder due to known physiological condition with depressive features: Secondary | ICD-10-CM | POA: Diagnosis not present

## 2019-02-21 DIAGNOSIS — C73 Malignant neoplasm of thyroid gland: Secondary | ICD-10-CM | POA: Diagnosis not present

## 2019-02-21 DIAGNOSIS — R1310 Dysphagia, unspecified: Secondary | ICD-10-CM | POA: Diagnosis not present

## 2019-02-21 DIAGNOSIS — I10 Essential (primary) hypertension: Secondary | ICD-10-CM | POA: Diagnosis not present

## 2019-02-22 DIAGNOSIS — G20 Parkinson's disease: Secondary | ICD-10-CM | POA: Diagnosis not present

## 2019-02-22 DIAGNOSIS — F0631 Mood disorder due to known physiological condition with depressive features: Secondary | ICD-10-CM | POA: Diagnosis not present

## 2019-02-22 DIAGNOSIS — R1310 Dysphagia, unspecified: Secondary | ICD-10-CM | POA: Diagnosis not present

## 2019-02-22 DIAGNOSIS — I10 Essential (primary) hypertension: Secondary | ICD-10-CM | POA: Diagnosis not present

## 2019-02-22 DIAGNOSIS — C73 Malignant neoplasm of thyroid gland: Secondary | ICD-10-CM | POA: Diagnosis not present

## 2019-02-23 DIAGNOSIS — R1310 Dysphagia, unspecified: Secondary | ICD-10-CM | POA: Diagnosis not present

## 2019-02-23 DIAGNOSIS — F0631 Mood disorder due to known physiological condition with depressive features: Secondary | ICD-10-CM | POA: Diagnosis not present

## 2019-02-23 DIAGNOSIS — G20 Parkinson's disease: Secondary | ICD-10-CM | POA: Diagnosis not present

## 2019-02-23 DIAGNOSIS — I10 Essential (primary) hypertension: Secondary | ICD-10-CM | POA: Diagnosis not present

## 2019-02-23 DIAGNOSIS — C73 Malignant neoplasm of thyroid gland: Secondary | ICD-10-CM | POA: Diagnosis not present

## 2019-02-24 DIAGNOSIS — I10 Essential (primary) hypertension: Secondary | ICD-10-CM | POA: Diagnosis not present

## 2019-02-24 DIAGNOSIS — C73 Malignant neoplasm of thyroid gland: Secondary | ICD-10-CM | POA: Diagnosis not present

## 2019-02-24 DIAGNOSIS — R1310 Dysphagia, unspecified: Secondary | ICD-10-CM | POA: Diagnosis not present

## 2019-02-24 DIAGNOSIS — G20 Parkinson's disease: Secondary | ICD-10-CM | POA: Diagnosis not present

## 2019-02-24 DIAGNOSIS — F0631 Mood disorder due to known physiological condition with depressive features: Secondary | ICD-10-CM | POA: Diagnosis not present

## 2019-02-25 DIAGNOSIS — I10 Essential (primary) hypertension: Secondary | ICD-10-CM | POA: Diagnosis not present

## 2019-02-25 DIAGNOSIS — C73 Malignant neoplasm of thyroid gland: Secondary | ICD-10-CM | POA: Diagnosis not present

## 2019-02-25 DIAGNOSIS — F0631 Mood disorder due to known physiological condition with depressive features: Secondary | ICD-10-CM | POA: Diagnosis not present

## 2019-02-25 DIAGNOSIS — R1310 Dysphagia, unspecified: Secondary | ICD-10-CM | POA: Diagnosis not present

## 2019-02-25 DIAGNOSIS — G20 Parkinson's disease: Secondary | ICD-10-CM | POA: Diagnosis not present

## 2019-02-26 DIAGNOSIS — C73 Malignant neoplasm of thyroid gland: Secondary | ICD-10-CM | POA: Diagnosis not present

## 2019-02-26 DIAGNOSIS — R1310 Dysphagia, unspecified: Secondary | ICD-10-CM | POA: Diagnosis not present

## 2019-02-26 DIAGNOSIS — I10 Essential (primary) hypertension: Secondary | ICD-10-CM | POA: Diagnosis not present

## 2019-02-26 DIAGNOSIS — G20 Parkinson's disease: Secondary | ICD-10-CM | POA: Diagnosis not present

## 2019-02-26 DIAGNOSIS — F0631 Mood disorder due to known physiological condition with depressive features: Secondary | ICD-10-CM | POA: Diagnosis not present

## 2019-03-02 DEATH — deceased
